# Patient Record
Sex: Male | Born: 1990 | Race: Black or African American | Hispanic: No | Marital: Single | Smoking: Current every day smoker
Health system: Southern US, Community
[De-identification: ages and names within clinical notes are randomized; demographics above are authoritative.]

---
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
## 2012-04-27 ENCOUNTER — Emergency Department (HOSPITAL_COMMUNITY): Payer: Medicaid Other

## 2012-04-27 ENCOUNTER — Encounter (HOSPITAL_COMMUNITY): Payer: Self-pay | Admitting: Emergency Medicine

## 2012-04-27 ENCOUNTER — Emergency Department (HOSPITAL_COMMUNITY)
Admission: EM | Admit: 2012-04-27 | Discharge: 2012-04-27 | Disposition: A | Discharge status: Home / Self Care | Payer: Medicaid Other | Attending: Emergency Medicine | Admitting: Emergency Medicine

## 2012-04-27 DIAGNOSIS — S199XXA Unspecified injury of neck, initial encounter: Secondary | ICD-10-CM | POA: Insufficient documentation

## 2012-04-27 DIAGNOSIS — S21109A Unspecified open wound of unspecified front wall of thorax without penetration into thoracic cavity, initial encounter: Secondary | ICD-10-CM

## 2012-04-27 DIAGNOSIS — S0990XA Unspecified injury of head, initial encounter: Secondary | ICD-10-CM | POA: Insufficient documentation

## 2012-04-27 DIAGNOSIS — T148XXA Other injury of unspecified body region, initial encounter: Secondary | ICD-10-CM | POA: Insufficient documentation

## 2012-04-27 DIAGNOSIS — S0993XA Unspecified injury of face, initial encounter: Secondary | ICD-10-CM | POA: Insufficient documentation

## 2012-04-27 LAB — URINALYSIS, MICROSCOPIC ONLY
Bilirubin Urine: NEGATIVE
Ketones, ur: 15 mg/dL — AB
Leukocytes, UA: NEGATIVE
Nitrite: NEGATIVE
Protein, ur: NEGATIVE mg/dL

## 2012-04-27 LAB — POCT I-STAT, CHEM 8
BUN: 17 mg/dL (ref 6–23)
Creatinine, Ser: 1.4 mg/dL — ABNORMAL HIGH (ref 0.50–1.35)
Hemoglobin: 18.4 g/dL — ABNORMAL HIGH (ref 13.0–17.0)
Potassium: 5.3 mEq/L — ABNORMAL HIGH (ref 3.5–5.1)
Sodium: 140 mEq/L (ref 135–145)
TCO2: 24 mmol/L (ref 0–100)

## 2012-04-27 LAB — CBC
MCH: 28.1 pg (ref 26.0–34.0)
MCHC: 33.7 g/dL (ref 30.0–36.0)
MCV: 83.3 fL (ref 78.0–100.0)
Platelets: 131 10*3/uL — ABNORMAL LOW (ref 150–400)

## 2012-04-27 LAB — PROTIME-INR
INR: 0.97 (ref 0.00–1.49)
Prothrombin Time: 13.1 seconds (ref 11.6–15.2)

## 2012-04-27 LAB — COMPREHENSIVE METABOLIC PANEL
ALT: 34 U/L (ref 0–53)
AST: 33 U/L (ref 0–37)
Albumin: 4.7 g/dL (ref 3.5–5.2)
Alkaline Phosphatase: 76 U/L (ref 39–117)
CO2: 21 mEq/L (ref 19–32)
Chloride: 101 mEq/L (ref 96–112)
GFR calc non Af Amer: >90 mL/min (ref >90)
Potassium: 4.3 mEq/L (ref 3.5–5.1)
Sodium: 140 mEq/L (ref 135–145)
Total Bilirubin: 0.8 mg/dL (ref 0.3–1.2)

## 2012-04-27 LAB — ABO/RH: ABO/RH(D): O NEG

## 2012-04-27 LAB — SAMPLE TO BLOOD BANK

## 2012-04-27 MED ORDER — HYDROCODONE-ACETAMINOPHEN 5-500 MG PO TABS: 1.0000 | ORAL_TABLET | Freq: Four times a day (QID) | ORAL | Status: AC | PRN

## 2012-04-27 MED ORDER — ONDANSETRON HCL 4 MG/2ML IJ SOLN
INTRAMUSCULAR | Status: AC
  Administered 2012-04-27: 4 mg
  Filled 2012-04-27: qty 2

## 2012-04-27 MED ORDER — IOHEXOL 300 MG/ML  SOLN
100.0000 mL | Freq: Once | INTRAMUSCULAR | Status: AC | PRN
  Administered 2012-04-27: 100 mL via INTRAVENOUS

## 2012-04-27 MED ORDER — MORPHINE SULFATE 4 MG/ML IJ SOLN
4.0000 mg | Freq: Once | INTRAMUSCULAR | Status: AC
  Administered 2012-04-27: 4 mg via INTRAVENOUS
  Filled 2012-04-27: qty 1

## 2012-04-27 MED ORDER — TETANUS-DIPHTHERIA TOXOIDS TD 5-2 LFU IM INJ
0.5000 mL | INJECTION | Freq: Once | INTRAMUSCULAR | Status: AC
  Administered 2012-04-27: 0.5 mL via INTRAMUSCULAR
  Filled 2012-04-27: qty 0.5

## 2012-04-27 MED ORDER — CEFAZOLIN SODIUM 1-5 GM-% IV SOLN
1.0000 g | Freq: Once | INTRAVENOUS | Status: AC
  Administered 2012-04-27: 1 g via INTRAVENOUS
  Filled 2012-04-27: qty 50
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
## 2012-04-27 NOTE — ED Notes (Signed)
Vital signs stable. 

## 2012-04-27 NOTE — ED Notes (Signed)
Family at beside. Family given emotional support. 
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
## 2012-04-28 LAB — TYPE AND SCREEN
Antibody Screen: NEGATIVE
Unit division: 0
Unit division: 0

## 2012-05-05 ENCOUNTER — Emergency Department (HOSPITAL_COMMUNITY)
Admission: EM | Admit: 2012-05-05 | Discharge: 2012-05-05 | Disposition: A | Discharge status: Home / Self Care | Payer: Medicaid Other | Attending: Emergency Medicine | Admitting: Emergency Medicine

## 2012-05-05 ENCOUNTER — Encounter (HOSPITAL_COMMUNITY): Payer: Self-pay | Admitting: Emergency Medicine

## 2012-05-05 DIAGNOSIS — Z87828 Personal history of other (healed) physical injury and trauma: Secondary | ICD-10-CM | POA: Insufficient documentation

## 2012-05-05 DIAGNOSIS — Z4802 Encounter for removal of sutures: Secondary | ICD-10-CM

## 2012-05-05 MED ORDER — BACITRACIN 500 UNIT/GM EX OINT
1.0000 "application " | TOPICAL_OINTMENT | Freq: Two times a day (BID) | CUTANEOUS | Status: DC
  Administered 2012-05-05: 1 via TOPICAL
  Filled 2012-05-05 (×2): qty 0.9
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
## 2012-05-05 NOTE — Discharge Instructions (Signed)
Scar Minimization You will have a scar anytime you have surgery and a cut is made in the skin or you have something removed from your skin (mole, skin cancer, cyst). Although scars are unavoidable following surgery, there are ways to minimize their appearance. It is important to follow all the instructions you receive from your caregiver about wound care. How your wound heals will influence the appearance of your scar. If you do not follow the wound care instructions as directed, complications such as infection may occur. Wound instructions include keeping the wound clean, moist, and not letting the wound form a scab. Some people form scars that are raised and lumpy (hypertrophic) or larger than the initial wound (keloidal). HOME CARE INSTRUCTIONS   Follow wound care instructions as directed.   Keep the wound clean by washing it with soap and water.   Keep the wound moist with provided antibiotic cream or petroleum jelly until completely healed. Moisten twice a day for about 2 weeks.   Get stitches (sutures) taken out at the scheduled time.   Avoid touching or manipulating your wound unless needed. Wash your hands thoroughly before and after touching your wound.   Follow all restrictions such as limits on exercise or work. This depends on where your scar is located.   Keep the scar protected from sunburn. Cover the scar with sunscreen/sunblock with SPF 30 or higher.   Gently massage the scar using a circular motion to help minimize the appearance of the scar. Do this only after the wound has closed and all the sutures have been removed.   For hypertrophic or keloidal scars, there are several ways to treat and minimize their appearance. Methods include compression therapy, intralesional corticosteroids, laser therapy, or surgery. These methods are performed by your caregiver.  Remember that the scar may appear lighter or darker than your normal skin color. This difference in color should even  out with time. SEEK MEDICAL CARE IF:   You have a fever.   You develop signs of infection such as pain, redness, pus, and warmth.   You have questions or concerns.  Document Released: 04/25/2010 Document Revised: 10/25/2011 Document Reviewed: 04/25/2010 [employer] Patient Information 2012 [employer]. 

## 2012-05-05 NOTE — ED Provider Notes (Signed)
History     CSN: [id number]  Arrival date & time 05/05/12  4098   First MD Initiated Contact with Patient 05/05/12 1000      Chief Complaint  Patient presents with  . Wound Check    (Consider location/radiation/quality/duration/timing/severity/associated sxs/prior treatment) Patient is a 21 y.o. male presenting with wound check. The history is provided by the patient.  Wound Check  He was treated in the ED 5 to 10 days ago. Previous treatment in the ED includes laceration repair. There has been no treatment since the wound repair. Fever duration: no fever. There has been colored (for first couple of days, resolved) discharge from the wound. There is no redness present. There is no swelling present. The pain has improved. He has no difficulty moving the affected extremity or digit.    History reviewed. No pertinent past medical history.  History reviewed. No pertinent past surgical history.  No family history on file.  History  Substance Use Topics  . Smoking status: Never Smoker   . Smokeless tobacco: Not on file  . Alcohol Use: Yes      Review of Systems  Constitutional: Negative for fever and chills.  Skin: Positive for wound. Negative for color change.    Allergies  Review of patient's allergies indicates no known allergies.  Home Medications   Current Outpatient Rx  Name Route Sig Dispense Refill  . HYDROCODONE-ACETAMINOPHEN 5-500 MG PO TABS Oral Take 1 tablet by mouth every 6 (six) hours as needed for pain. 30 tablet 0    BP 108/69  Pulse 68  Temp 97.9 F (36.6 C) (Oral)  Resp 15  SpO2 100%  Physical Exam  Nursing note reviewed. Constitutional: He appears well-developed and well-nourished. No distress.       Vital signs are reviewed and are normal.   HENT:  Head: Normocephalic.  Eyes: Conjunctivae are normal.  Neck: Neck supple.  Cardiovascular: Normal rate.   Pulmonary/Chest: No respiratory distress.  Neurological: He is alert.  Skin: Skin is  warm and dry.       ED Course  SUTURE REMOVAL Performed by: Lorenz Coaster JUSTICE Authorized by: Shaaron Adler Consent: Verbal consent obtained. Risks and benefits: risks, benefits and alternatives were discussed Consent given by: patient Patient understanding: patient states understanding of the procedure being performed Patient identity confirmed: verbally with patient Body area: trunk Location details: back Wound Appearance: clean Staples Removed: 6 Post-removal: antibiotic ointment applied and dressing applied Facility: sutures placed in this facility Patient tolerance: Patient tolerated the procedure well with no immediate complications.   (including critical care time)  Labs Reviewed - No data to display No results found.   1. Encounter for staple removal       MDM  Staple removal. Healing well.         Shaaron Adler, PA-C 05/05/12 1031

## 2012-05-06 NOTE — ED Provider Notes (Signed)
Medical screening examination/treatment/procedure(s) were performed by non-physician practitioner and as supervising physician I was immediately available for consultation/collaboration.   Glynn Octave, MD 05/06/12 [phone]

## 2014-11-27 ENCOUNTER — Encounter (HOSPITAL_COMMUNITY): Payer: Self-pay | Admitting: Emergency Medicine

## 2014-11-27 ENCOUNTER — Emergency Department (HOSPITAL_COMMUNITY)
Admission: EM | Admit: 2014-11-27 | Discharge: 2014-11-27 | Disposition: A | Discharge status: Home / Self Care | Payer: Medicaid Other | Attending: Emergency Medicine | Admitting: Emergency Medicine

## 2014-11-27 DIAGNOSIS — J029 Acute pharyngitis, unspecified: Secondary | ICD-10-CM

## 2014-11-27 DIAGNOSIS — J36 Peritonsillar abscess: Secondary | ICD-10-CM

## 2014-11-27 DIAGNOSIS — F419 Anxiety disorder, unspecified: Secondary | ICD-10-CM | POA: Insufficient documentation

## 2014-11-27 DIAGNOSIS — Z87891 Personal history of nicotine dependence: Secondary | ICD-10-CM | POA: Insufficient documentation

## 2014-11-27 LAB — RAPID STREP SCREEN (MED CTR MEBANE ONLY): Streptococcus, Group A Screen (Direct): NEGATIVE

## 2014-11-27 MED ORDER — HYDROCODONE-ACETAMINOPHEN 7.5-325 MG/15ML PO SOLN: 10.0000 mL | Freq: Four times a day (QID) | ORAL | Status: DC | PRN

## 2014-11-27 MED ORDER — HYDROCODONE-ACETAMINOPHEN 7.5-325 MG/15ML PO SOLN
10.0000 mL | Freq: Once | ORAL | Status: AC
Start: 2014-11-27 — End: 2014-11-27
  Administered 2014-11-27: 10 mL via ORAL
  Filled 2014-11-27: qty 15

## 2014-11-27 MED ORDER — CLINDAMYCIN HCL 150 MG PO CAPS: 300.0000 mg | ORAL_CAPSULE | Freq: Three times a day (TID) | ORAL | Status: DC

## 2014-11-27 MED ORDER — CLINDAMYCIN HCL 150 MG PO CAPS
300.0000 mg | ORAL_CAPSULE | Freq: Once | ORAL | Status: AC
  Administered 2014-11-27: 300 mg via ORAL
  Filled 2014-11-27: qty 2

## 2014-11-27 NOTE — ED Notes (Signed)
Patient here with complaint of sore throat with right sided neck pain. States that it began 2 weeks ago. Denies fevers at home. States that pain sometimes is on the left. Additionally reports lots of pain with swallowing.

## 2014-11-27 NOTE — ED Provider Notes (Signed)
CSN: [id number]     Arrival date & time 11/27/14  2032 History  This chart was scribed for Junius FinnerErin O'Malley, working with Juliet RudeNathan R. Rubin PayorPickering, MD, by Roxy Cedarhandni Bhalodia ED Scribe. This patient was seen in room TR05C/TR05C and the patient's care was started at 9:03 PM  Chief Complaint  Patient presents with  . Sore Throat   Patient is a 24 y.o. male presenting with pharyngitis. The history is provided by the patient. No language interpreter was used.  Sore Throat   HPI Comments: Michael Sutton is a 24 y.o. male who presents to the Emergency Department complaining of sore throat, neck pain, and swollen glands that began 2 weeks ago. He denies associated fever, nausea or vomiting. He reports associated trouble swallowing. Patient states he has been taking advil and liquids with mild relief. Patient has trouble speaking since 5:00PM earlier today. He states that the neck pain is worse on the left side. He currently rates his pain as 7/10. Denies fever or chills. Denies n/v/d.  History reviewed. No pertinent past medical history. History reviewed. No pertinent past surgical history. History reviewed. No pertinent family history. History  Substance Use Topics  . Smoking status: Former Smoker    Quit date: 11/13/2014  . Smokeless tobacco: Not on file  . Alcohol Use: Yes   Review of Systems  Constitutional: Negative for fever.  HENT: Positive for sore throat and trouble swallowing.   Musculoskeletal: Positive for neck pain.  All other systems reviewed and are negative.  Allergies  Review of patient's allergies indicates no known allergies.  Home Medications   Prior to Admission medications   Medication Sig Start Date End Date Taking? Authorizing Provider  clindamycin (CLEOCIN) 150 MG capsule Take 2 capsules (300 mg total) by mouth 3 (three) times daily. Take 2 capsules three times daily for 10 days 11/27/14   Junius FinnerErin O'Malley, PA-C  HYDROcodone-acetaminophen (HYCET) 7.5-325 mg/15 ml solution Take  10 mLs by mouth every 6 (six) hours as needed for moderate pain. 11/27/14   Junius FinnerErin O'Malley, PA-C   Triage Vitals: BP 113/62 mmHg  Pulse 107  Temp(Src) 98.2 F (36.8 C) (Oral)  Resp 20  Ht 6' (1.829 m)  Wt 180 lb (81.647 kg)  BMI 24.41 kg/m2  SpO2 100%  Physical Exam  Constitutional: He appears well-developed and well-nourished.  Sitting in exam chair. Non toxic appearing. Mildly anxious.  HENT:  Head: Normocephalic and atraumatic.  Significant bilateral tonsillar edema and erythema without exudates. Uvula midline. Decreased airway due to significant edema.   Eyes: Conjunctivae are normal. No scleral icterus.  Neck: Normal range of motion.  Muffled voice without stridor. Anterior cervical adenopathy, right greater than the left.  Cardiovascular: Normal rate, regular rhythm and normal heart sounds.   Pulmonary/Chest: Effort normal and breath sounds normal. No respiratory distress. He has no wheezes. He has no rales. He exhibits no tenderness.  Abdominal: Soft. Bowel sounds are normal. He exhibits no distension and no mass. There is no tenderness. There is no rebound and no guarding.  Musculoskeletal: Normal range of motion.  Neurological: He is alert.  Skin: Skin is warm and dry.  Nursing note and vitals reviewed.  ED Course  Procedures (including critical care time)  DIAGNOSTIC STUDIES: Oxygen Saturation is 100% on RA, normal by my interpretation.    COORDINATION OF CARE: 9:05 PM- Discussed plans to order diagnostic rapid strep test. Pt advised of plan for treatment and pt agrees.  Labs Review Labs Reviewed  RAPID STREP SCREEN  CULTURE, GROUP A STREP   Imaging Review No results found.   EKG Interpretation None     MDM   Final diagnoses:  Peritonsillar abscess  Sore throat    Pt is a 24yo male c/o sore throat, change in voice starting around 5PM today. Concern for peritonsillar abscess.  Discussed pt with Dr. Rubin Payor who also examined pt, agrees.  Consulted with  Dr. Pollyann Kennedy, ENT, advised pt may be tx with Clindamycin  TID and pain medication. Advised to call office Monday, 11/29/14 to schedule appointment.   Pt was able to swallow 2 capsules of clindamycin in ED w/o difficulty.  Pt is hemodynamically stable for discharge home. Home care instructions provided.  Strict return precautions provided. Pt verbalized understanding and agreement with tx plan.   I personally performed the services described in this documentation, which was scribed in my presence. The recorded information has been reviewed and is accurate.  Junius Finner, PA-C 11/27/14 2208  Juliet Rude. Rubin Payor, MD 11/28/14 1534

## 2014-11-30 LAB — CULTURE, GROUP A STREP

## 2014-12-01 ENCOUNTER — Telehealth (HOSPITAL_BASED_OUTPATIENT_CLINIC_OR_DEPARTMENT_OTHER): Payer: Self-pay | Admitting: Emergency Medicine

## 2014-12-01 NOTE — Telephone Encounter (Signed)
Post ED Visit - Positive Culture Follow-up  Culture report reviewed by antimicrobial stewardship pharmacist: []  Wes Dulaney, Pharm.D., BCPS []  Celedonio MiyamotoJeremy Frens, Pharm.D., BCPS []  Georgina PillionElizabeth Martin, Pharm.D., BCPS []  NapavineMinh Pham, VermontPharm.D., BCPS, AAHIVP []  Estella HuskMichelle Turner, Pharm.D., BCPS, AAHIVP [x]  Elder CyphersLorie Poole, [address]Pharm.D., BCPS  Positive strep culture Treated with clindamycin, organism sensitive to the same and no further patient follow-up is required at this time.  Berle MullMiller, Vesper Trant 12/01/2014, 3:37 PM

## 2017-05-31 ENCOUNTER — Emergency Department (HOSPITAL_COMMUNITY)
Admission: EM | Admit: 2017-05-31 | Discharge: 2017-05-31 | Disposition: A | Discharge status: Home / Self Care | Payer: Self-pay | Attending: Emergency Medicine | Admitting: Emergency Medicine

## 2017-05-31 ENCOUNTER — Encounter (HOSPITAL_COMMUNITY): Payer: Self-pay

## 2017-05-31 ENCOUNTER — Emergency Department (HOSPITAL_COMMUNITY): Payer: Self-pay

## 2017-05-31 DIAGNOSIS — S62164A Nondisplaced fracture of pisiform, right wrist, initial encounter for closed fracture: Secondary | ICD-10-CM | POA: Insufficient documentation

## 2017-05-31 DIAGNOSIS — Y999 Unspecified external cause status: Secondary | ICD-10-CM | POA: Insufficient documentation

## 2017-05-31 DIAGNOSIS — R300 Dysuria: Secondary | ICD-10-CM | POA: Insufficient documentation

## 2017-05-31 DIAGNOSIS — Z87891 Personal history of nicotine dependence: Secondary | ICD-10-CM | POA: Insufficient documentation

## 2017-05-31 DIAGNOSIS — Y929 Unspecified place or not applicable: Secondary | ICD-10-CM | POA: Insufficient documentation

## 2017-05-31 DIAGNOSIS — Y939 Activity, unspecified: Secondary | ICD-10-CM | POA: Insufficient documentation

## 2017-05-31 LAB — URINALYSIS, ROUTINE W REFLEX MICROSCOPIC
Bilirubin Urine: NEGATIVE
GLUCOSE, UA: NEGATIVE mg/dL
Hgb urine dipstick: NEGATIVE
Ketones, ur: NEGATIVE mg/dL
LEUKOCYTES UA: NEGATIVE
Nitrite: NEGATIVE
PH: 7 (ref 5.0–8.0)
PROTEIN: NEGATIVE mg/dL
Specific Gravity, Urine: 1.023 (ref 1.005–1.030)

## 2017-05-31 MED ORDER — IBUPROFEN 600 MG PO TABS: 600.0000 mg | ORAL_TABLET | Freq: Four times a day (QID) | ORAL | 0 refills | Status: DC | PRN
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
## 2017-05-31 NOTE — ED Notes (Signed)
Pt is back from x-ray

## 2017-05-31 NOTE — Discharge Instructions (Addendum)
Please read and follow all provided instructions.  Your diagnoses today include:  1. Closed nondisplaced fracture of pisiform of right wrist, initial encounter     Tests performed today include: Vital signs. See below for your results today.   Medications prescribed:  Take as prescribed   Home care instructions:  Follow any educational materials contained in this packet.  Follow-up instructions: Please follow-up with your primary care provider for further evaluation of symptoms and treatment   Return instructions:  Please return to the Emergency Department if you do not get better, if you get worse, or new symptoms OR  - Fever (temperature greater than 101.19F)  - Bleeding that does not stop with holding pressure to the area    -Severe pain (please note that you may be more sore the day after your accident)  - Chest Pain  - Difficulty breathing  - Severe nausea or vomiting  - Inability to tolerate food and liquids  - Passing out  - Skin becoming red around your wounds  - Change in mental status (confusion or lethargy)  - New numbness or weakness    Please return if you have any other emergent concerns.  Additional Information:  Your vital signs today were: BP 105/75 (BP Location: Left Arm)    Pulse 65    Temp 98 F (36.7 C) (Oral)    Resp 17    SpO2 99%  If your blood pressure (BP) was elevated above 135/85 this visit, please have this repeated by your doctor within one month. ---------------
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
## 2017-06-03 ENCOUNTER — Encounter: Payer: Self-pay | Admitting: Emergency Medicine

## 2017-06-03 ENCOUNTER — Telehealth: Payer: Self-pay | Admitting: Medical

## 2017-06-03 DIAGNOSIS — A749 Chlamydial infection, unspecified: Secondary | ICD-10-CM

## 2017-06-03 LAB — GC/CHLAMYDIA PROBE AMP (~~LOC~~) NOT AT ARMC
CHLAMYDIA, DNA PROBE: POSITIVE — AB
Neisseria Gonorrhea: NEGATIVE

## 2017-06-03 MED ORDER — AZITHROMYCIN 250 MG PO TABS: 1000.0000 mg | ORAL_TABLET | Freq: Once | ORAL | 0 refills | Status: AC

## 2017-06-03 NOTE — Telephone Encounter (Addendum)
Michael Sutton tested positive for  Chlamydia. Patient was called by RN and allergies and pharmacy confirmed. Rx sent to pharmacy of choice.   Marny LowensteinWenzel, Julie N, PA-C 06/03/2017 1:23 PM       ----- Message from Kathe BectonLori S Berdik, RN sent at 06/03/2017  9:55 AM EDT ----- This patient tested positive for chlamydia Hs NKDA, I have informed the patient of his results and confirmed his pharmacy is correct in her chart. Please send Rx.   Thank you,   Kathe BectonBerdik, Lori S, RN   [facility]

## 2017-12-06 ENCOUNTER — Other Ambulatory Visit: Payer: Self-pay

## 2017-12-06 ENCOUNTER — Encounter (HOSPITAL_COMMUNITY): Payer: Self-pay | Admitting: Emergency Medicine

## 2017-12-06 ENCOUNTER — Ambulatory Visit (HOSPITAL_COMMUNITY)
Admission: EM | Admit: 2017-12-06 | Discharge: 2017-12-06 | Disposition: A | Discharge status: Home / Self Care | Payer: Self-pay | Attending: Emergency Medicine | Admitting: Emergency Medicine

## 2017-12-06 DIAGNOSIS — Z113 Encounter for screening for infections with a predominantly sexual mode of transmission: Secondary | ICD-10-CM | POA: Insufficient documentation

## 2017-12-06 DIAGNOSIS — F17210 Nicotine dependence, cigarettes, uncomplicated: Secondary | ICD-10-CM | POA: Insufficient documentation

## 2017-12-06 DIAGNOSIS — N4889 Other specified disorders of penis: Secondary | ICD-10-CM

## 2017-12-06 DIAGNOSIS — R21 Rash and other nonspecific skin eruption: Secondary | ICD-10-CM | POA: Insufficient documentation

## 2017-12-06 LAB — POCT URINALYSIS DIP (DEVICE)
BILIRUBIN URINE: NEGATIVE
Glucose, UA: NEGATIVE mg/dL
Hgb urine dipstick: NEGATIVE
Ketones, ur: NEGATIVE mg/dL
LEUKOCYTES UA: NEGATIVE
NITRITE: NEGATIVE
PH: 7 (ref 5.0–8.0)
Protein, ur: NEGATIVE mg/dL
Specific Gravity, Urine: 1.02 (ref 1.005–1.030)
Urobilinogen, UA: 0.2 mg/dL (ref 0.0–1.0)

## 2017-12-06 MED ORDER — AZITHROMYCIN 250 MG PO TABS
ORAL_TABLET | ORAL | Status: AC
  Filled 2017-12-06: qty 4

## 2017-12-06 MED ORDER — AZITHROMYCIN 250 MG PO TABS
1000.0000 mg | ORAL_TABLET | Freq: Once | ORAL | Status: AC
  Administered 2017-12-06: 1000 mg via ORAL

## 2017-12-06 MED ORDER — MUPIROCIN 2 % EX OINT: 1.0000 "application " | TOPICAL_OINTMENT | Freq: Three times a day (TID) | CUTANEOUS | 0 refills | Status: DC

## 2017-12-06 MED ORDER — CEFTRIAXONE SODIUM 250 MG IJ SOLR
INTRAMUSCULAR | Status: AC
  Filled 2017-12-06: qty 250

## 2017-12-06 MED ORDER — CEFTRIAXONE SODIUM 250 MG IJ SOLR
250.0000 mg | Freq: Once | INTRAMUSCULAR | Status: AC
  Administered 2017-12-06: 250 mg via INTRAMUSCULAR

## 2017-12-06 MED ORDER — ACYCLOVIR 800 MG PO TABS: 800.0000 mg | ORAL_TABLET | Freq: Three times a day (TID) | ORAL | 0 refills | Status: AC

## 2017-12-06 NOTE — ED Triage Notes (Signed)
Pt reports a small red spot/rash on the side of his penis that has been there for 3-4 days.

## 2017-12-06 NOTE — ED Provider Notes (Signed)
HPI  SUBJECTIVE:  Michael Sutton is a 27 y.o. male who presents with a penile rash starting 3-4 days ago.  There were no proceeding paresthesias.  It does not hurt, itch, burn.  He denies ulcers. States that they started off as "bumps"   He denies penile discharge, rash elsewhere, testicular scrotal erythema, pain, edema.  No flulike symptoms.  No dysuria, urgency, frequency, cloudy odorous urine, hematuria.  No fevers.  He had a rash in this area before and was thought to have a "STD" but he does not remember the diagnosis.  States that he was given some pills which he did not take as prescribed.  He has tried hydrocortisone and triple antibiotic ointment with some improvement of symptoms.  No aggravating factors.  He is in a new monogamous relationship with a male who is asymptomatic.  He has a past medical history of an unknown STD.  He denies history of gonorrhea, chlamydia, HIV, HSV, syphilis, Trichomonas.  PMD: None.  History reviewed. No pertinent past medical history.  History reviewed. No pertinent surgical history.  History reviewed. No pertinent family history.  Social History   Tobacco Use  . Smoking status: Current Every Day Smoker    Packs/day: 0.25    Types: Cigarettes  . Smokeless tobacco: Never Used  Substance Use Topics  . Alcohol use: Yes  . Drug use: No    No current facility-administered medications for this encounter.   Current Outpatient Medications:  .  acyclovir (ZOVIRAX) 800 MG tablet, Take 1 tablet (800 mg total) by mouth 3 (three) times daily for 2 days. X 2 days, Disp: 12 tablet, Rfl: 0 .  mupirocin ointment (BACTROBAN) 2 %, Apply 1 application topically 3 (three) times daily., Disp: 22 g, Rfl: 0  No Known Allergies   ROS  As noted in HPI.   Physical Exam  BP 94/74 (BP Location: Left Arm)   Pulse 87   Temp (!) 97.3 F (36.3 C) (Oral)   SpO2 100%   Constitutional: Well developed, well nourished, no acute distress Eyes:  EOMI, conjunctiva  normal bilaterally HENT: Normocephalic, atraumatic,mucus membranes moist Respiratory: Normal inspiratory effort Cardiovascular: Normal rate GI: nondistended GU: Nontender ulcers on the shaft of the right penis.  3 bumps that appear to be vesicular.  No discharge.  No other genital rash.  No testicular or epididymal tenderness, swelling.  No scrotal edema, erythema, tenderness. patient declined chaperone.      Lymph: No inguinal lymphadenopathy skin: No palmar rash, Musculoskeletal: no deformities Neurologic: Alert & oriented x 3, no focal neuro deficits Psychiatric: Speech and behavior appropriate   ED Course   Medications  cefTRIAXone (ROCEPHIN) injection 250 mg (250 mg Intramuscular Given 12/06/17 1217)  azithromycin (ZITHROMAX) tablet 1,000 mg (1,000 mg Oral Given 12/06/17 1217)    Orders Placed This Encounter  Procedures  . Hsv Culture And Typing    Standing Status:   Standing    Number of Occurrences:   1    Order Specific Question:   Patient immune status    Answer:   Normal  . POCT urinalysis dip (device)    Standing Status:   Standing    Number of Occurrences:   1    Results for orders placed or performed during the hospital encounter of 12/06/17 (from the past 24 hour(s))  RPR     Status: None   Collection Time: 12/06/17 11:59 AM  Result Value Ref Range   RPR Ser Ql Non Reactive Non Reactive  HIV antibody     Status: None   Collection Time: 12/06/17 11:59 AM  Result Value Ref Range   HIV Screen 4th Generation wRfx Non Reactive Non Reactive  POCT urinalysis dip (device)     Status: None   Collection Time: 12/06/17 12:21 PM  Result Value Ref Range   Glucose, UA NEGATIVE NEGATIVE mg/dL   Bilirubin Urine NEGATIVE NEGATIVE   Ketones, ur NEGATIVE NEGATIVE mg/dL   Specific Gravity, Urine 1.020 1.005 - 1.030   Hgb urine dipstick NEGATIVE NEGATIVE   pH 7.0 5.0 - 8.0   Protein, ur NEGATIVE NEGATIVE mg/dL   Urobilinogen, UA 0.2 0.0 - 1.0 mg/dL   Nitrite NEGATIVE  NEGATIVE   Leukocytes, UA NEGATIVE NEGATIVE   No results found.  ED Clinical Impression  Rash  Screening for STDs (sexually transmitted diseases)   ED Assessment/Plan  Suspect HSV as this is a recurrent rash in the same area.  Will swab this for HSV.  Will also treat empirically for gonorrhea and chlamydia today with Rocephin and azithromycin as patient is an unreliable historian.  We will send off RPR, HIV, HSV, gonorrhea, chlamydia, trichomonas. Of note, pt urinated immediately prior to evaluation. Will need to collect a specimen 1 hour after this.  Advised patient to give [place] a working phone numbers so that we can contact him with the results.  Discussed with him that he will need to have his partner/partners treated if any of his labs come back positive.  Will send home with acyclovir for the next outbreak.  Continue antibiotic ointment for now. Follow up with PMD of choice or health department.  Discussed  MDM, plan and followup with patient. . patient agrees with plan.   Meds ordered this encounter  Medications  . cefTRIAXone (ROCEPHIN) injection 250 mg  . azithromycin (ZITHROMAX) tablet 1,000 mg  . acyclovir (ZOVIRAX) 800 MG tablet    Sig: Take 1 tablet (800 mg total) by mouth 3 (three) times daily for 2 days. X 2 days    Dispense:  12 tablet    Refill:  0  . mupirocin ointment (BACTROBAN) 2 %    Sig: Apply 1 application topically 3 (three) times daily.    Dispense:  22 g    Refill:  0    *This clinic note was created using Scientist, clinical (histocompatibility and immunogenetics). Therefore, there may be occasional mistakes despite careful proofreading.   ?   Domenick Gong, MD 12/07/17 [phone]

## 2017-12-06 NOTE — Discharge Instructions (Signed)
Give us a working phone number so that we can contact you if needed. Refrain from sexual contact until you know your results and your partner(s) are treated if necessary. Return to the ER if you get worse, have a fever >100.4, or for any concerns.  Do not start the acyclovir at this point in time.  Rather save it if this rash comes back again.  Start the acyclovir immediately if that happens.  In the meantime, continue the triple antibiotic ointment or you may use the Bactroban.  Below is a list of primary care practices who are taking new patients for you to follow-up with. [facility] [address] [place], [place] [address] [phone]  [facility] [phone] [address] [place] [address]  [facility]: [address] [place] [address]  [phone]  [facility]: [address] [place] [address]   [phone]  [facility]: [address] [place] [address]  [phone]  [facility] : [address] [place] [address] [phone]  [facility]: [address] [place] [phone]  [facility]: [address] [place] [address] [phone]  Dr. Oneal GroutMahima Pandey [address] [facility] [address] [address] [facility] [place] [address]  [phone]  Dr. Jackie PlumGeorge Osei-Bonsu, [facility]. [address] [place], [place] [address]  [phone]  Go to www.goodrx.com to look up your medications. This will give you a list of where you can find your prescriptions at the most affordable prices. Or ask the pharmacist what the cash price is, or if they have any other discount programs available to help make your medication more affordable. This can be  less expensive than what you would pay with insurance.

## 2017-12-06 NOTE — ED Notes (Signed)
[facility]

## 2017-12-06 NOTE — ED Notes (Signed)
Patient is drinking water, still unable to void

## 2017-12-07 LAB — HIV ANTIBODY (ROUTINE TESTING W REFLEX): HIV Screen 4th Generation wRfx: NONREACTIVE

## 2017-12-07 LAB — RPR: RPR Ser Ql: NONREACTIVE

## 2017-12-09 LAB — HSV CULTURE AND TYPING

## 2017-12-09 LAB — URINE CYTOLOGY ANCILLARY ONLY
CHLAMYDIA, DNA PROBE: NEGATIVE
Neisseria Gonorrhea: NEGATIVE
Trichomonas: NEGATIVE

## 2018-03-27 ENCOUNTER — Emergency Department (HOSPITAL_COMMUNITY)
Admission: EM | Admit: 2018-03-27 | Discharge: 2018-03-27 | Disposition: A | Discharge status: Home / Self Care | Payer: BLUE CROSS/BLUE SHIELD | Attending: Emergency Medicine | Admitting: Emergency Medicine

## 2018-03-27 ENCOUNTER — Other Ambulatory Visit: Payer: Self-pay

## 2018-03-27 ENCOUNTER — Emergency Department (HOSPITAL_COMMUNITY): Payer: BLUE CROSS/BLUE SHIELD

## 2018-03-27 ENCOUNTER — Encounter (HOSPITAL_COMMUNITY): Payer: Self-pay | Admitting: Emergency Medicine

## 2018-03-27 DIAGNOSIS — M25512 Pain in left shoulder: Secondary | ICD-10-CM | POA: Insufficient documentation

## 2018-03-27 DIAGNOSIS — W19XXXA Unspecified fall, initial encounter: Secondary | ICD-10-CM

## 2018-03-27 DIAGNOSIS — F17210 Nicotine dependence, cigarettes, uncomplicated: Secondary | ICD-10-CM | POA: Diagnosis not present

## 2018-03-27 MED ORDER — METHOCARBAMOL 500 MG PO TABS: 500.0000 mg | ORAL_TABLET | Freq: Three times a day (TID) | ORAL | 0 refills | Status: DC | PRN

## 2018-03-27 MED ORDER — NAPROXEN 500 MG PO TABS: 500.0000 mg | ORAL_TABLET | Freq: Two times a day (BID) | ORAL | 0 refills | Status: DC

## 2018-03-27 NOTE — ED Provider Notes (Signed)
[facility] EMERGENCY DEPARTMENT Provider Note   CSN: [id number] Arrival date & time: 03/27/18  0825     History   Chief Complaint Chief Complaint  Patient presents with  . Shoulder Pain    HPI Michael Sutton is a 27 y.o. male. With a hx of tobacco abuse who presents to the ED with complaint of L shoulder pain that started approximately 5 days ago. Patient states he miss-stepped leading to a fall down a few stairs during which he fell on the L shoulder. States he may have hit his head, but is insure, he did not have any LOC. He states he is only having discomfort to the L shoulder from the fall. States pain is a 7/10 in severity, worse with movement, alleviated by nothing in particular. He did take an advil once without change. No meds today. Denies numbness, weakness, change in vision, or neck pain.   HPI  History reviewed. No pertinent past medical history.  There are no active problems to display for this patient.   History reviewed. No pertinent surgical history.      Home Medications    Prior to Admission medications   Medication Sig Start Date End Date Taking? Authorizing Provider  mupirocin ointment (BACTROBAN) 2 % Apply 1 application topically 3 (three) times daily. 12/06/17   Domenick Gong, MD    Family History History reviewed. No pertinent family history.  Social History Social History   Tobacco Use  . Smoking status: Current Every Day Smoker    Packs/day: 0.25    Types: Cigarettes  . Smokeless tobacco: Never Used  Substance Use Topics  . Alcohol use: Yes  . Drug use: No     Allergies   Patient has no known allergies.   Review of Systems Review of Systems  Constitutional: Negative for chills and fever.  Eyes: Negative for visual disturbance.  Respiratory: Negative for shortness of breath.   Cardiovascular: Negative for chest pain.  Musculoskeletal: Positive for arthralgias (Left shoulder). Negative for back pain and  neck pain.  Neurological: Negative for dizziness, weakness, light-headedness, numbness and headaches.     Physical Exam Updated Vital Signs BP 123/85 (BP Location: Right Arm)   Pulse 66   Temp 97.7 F (36.5 C)   Resp 12   SpO2 100%   Physical Exam  Constitutional: He appears well-developed and well-nourished.  Non-toxic appearance. No distress.  HENT:  Head: Normocephalic and atraumatic.  Right Ear: No hemotympanum.  Left Ear: No hemotympanum.  Nose: Nose normal.  Mouth/Throat: Uvula is midline.  Eyes: Pupils are equal, round, and reactive to light. Conjunctivae and EOM are normal. Right eye exhibits no discharge. Left eye exhibits no discharge.  Neck: Normal range of motion. Neck supple. No spinous process tenderness present.  Cardiovascular: Normal rate and regular rhythm.  No murmur heard. Pulses:      Radial pulses are 2+ on the right side, and 2+ on the left side.  Pulmonary/Chest: Breath sounds normal. No respiratory distress. He has no wheezes. He has no rales.  Abdominal: Soft. He exhibits no distension. There is no tenderness.  Musculoskeletal:  No obvious deformity, erythema, ecchymosis.  Upper extremities: Patient has full range of motion to bilateral shoulders, elbows, and wrist, he has some discomfort with left shoulder flexion.  He is tender to palpation over the trapezius, supraspinatus, and infraspinatus muscles on the left side.  No point/focal bony tenderness.  Upper extremities otherwise nontender patient is neurovascularly intact distally. Back: No  midline tenderness to palpation. Lower extremities: Nontender, normal range of motion.  Neurological: He is alert.  Clear speech.   Skin: Skin is warm and dry. No rash noted.  Psychiatric: He has a normal mood and affect. His behavior is normal.  Nursing note and vitals reviewed.   ED Treatments / Results  Labs (all labs ordered are listed, but only abnormal results are displayed) Labs Reviewed - No data to  display  EKG None  Radiology Dg Shoulder Left  Result Date: 03/27/2018 CLINICAL DATA:  Progressive shoulder pain since falling 2 weeks ago. Initial encounter. EXAM: LEFT SHOULDER - 2+ VIEW COMPARISON:  None. FINDINGS: The mineralization and alignment are normal. There is no evidence of acute fracture or dislocation. The subacromial space is preserved. No significant arthropathic changes. IMPRESSION: No evidence of acute left shoulder injury. Electronically Signed   By: Carey Bullocks M.D.   On: 03/27/2018 09:40    Procedures Procedures (including critical care time)  Medications Ordered in ED Medications - No data to display   Initial Impression / Assessment and Plan / ED Course  I have reviewed the triage vital signs and the nursing notes.  Pertinent labs & imaging results that were available during my care of the patient were reviewed by me and considered in my medical decision making (see chart for details).    Patient presents status post mechanical fall complaining of left shoulder pain.  Patient is nontoxic-appearing, in no apparent distress, initial vitals WNL.  No evidence of serious head/neck/back injury.  Left shoulder with muscular tenderness, no point/focal bony tenderness.  X-ray negative for fracture dislocation.  He is neurovascularly intact distally.  Will treat with naproxen and Robaxin, patient is aware he is not to drive or operate heavy machinery while taking Robaxin, with orthopedics follow-up. I discussed results, treatment plan, need for ortho follow-up, and return precautions with the patient. Provided opportunity for questions, patient confirmed understanding and is in agreement with plan.   Final Clinical Impressions(s) / ED Diagnoses   Final diagnoses:  Fall, initial encounter  Acute pain of left shoulder    ED Discharge Orders        Ordered    naproxen (NAPROSYN) 500 MG tablet  2 times daily     03/27/18 1033    methocarbamol (ROBAXIN) 500 MG tablet   Every 8 hours PRN     03/27/18 1033       Aldean Pipe, [place] R, PA-C 03/27/18 1103    Vanetta Mulders, MD 03/27/18 1616

## 2018-03-27 NOTE — Discharge Instructions (Signed)
Please read and follow all provided instructions.  You have been seen today for left shoulder pain after a fall.   Tests performed today include: An x-ray of the affected area - does NOT show any broken bones or dislocations.  Vital signs. See below for your results today.   Medications:  Naproxen is a nonsteroidal anti-inflammatory medication that will help with pain and swelling. Be sure to take this medication as prescribed with food, 1 pill every 12 hours,  It should be taken with food, as it can cause stomach upset, and more seriously, stomach bleeding. Do not take other nonsteroidal anti-inflammatory medications with this such as Advil, Motrin, or Aleve.   Robaxin is the muscle relaxer I have prescribed, this is meant to help with muscle tightness. Be aware that this medication may make you drowsy therefore the first time you take this it should be at a time you are in an environment where you can rest. Do not drive or operate heavy machinery when taking this medication.   You may also take Tylenol per over-the-counter dosing with these medications safely.   Follow-up instructions: Please follow-up with your primary care provider or the provided orthopedic physician (bone specialist) if you continue to have significant pain in 1 week. In this case you may have a more severe injury that requires further care.   Return instructions:  Please return if your fingers or hands are numb or tingling, appear gray or blue, or you have severe pain  Please return to the Emergency Department if you experience worsening symptoms.  Please return if you have any other emergent concerns. Additional Information:  Your vital signs today were: BP 123/85 (BP Location: Right Arm)    Pulse 66    Temp 97.7 F (36.5 C)    Resp 12    SpO2 100%  If your blood pressure (BP) was elevated above 135/85 this visit, please have this repeated by your doctor within one month. ---------------

## 2018-03-27 NOTE — ED Triage Notes (Signed)
Patient complains of left shoulder pain that increases with movement after a fall Tuesday. Denies LOC. Minimal decrease in range of motion.

## 2018-04-13 ENCOUNTER — Ambulatory Visit (HOSPITAL_COMMUNITY)
Admission: EM | Admit: 2018-04-13 | Discharge: 2018-04-13 | Disposition: A | Discharge status: Home / Self Care | Payer: BLUE CROSS/BLUE SHIELD | Attending: Family Medicine | Admitting: Family Medicine

## 2018-04-13 ENCOUNTER — Encounter (HOSPITAL_COMMUNITY): Payer: Self-pay | Admitting: *Deleted

## 2018-04-13 ENCOUNTER — Other Ambulatory Visit: Payer: Self-pay

## 2018-04-13 DIAGNOSIS — W19XXXA Unspecified fall, initial encounter: Secondary | ICD-10-CM

## 2018-04-13 DIAGNOSIS — M25512 Pain in left shoulder: Secondary | ICD-10-CM

## 2018-04-13 DIAGNOSIS — S46012A Strain of muscle(s) and tendon(s) of the rotator cuff of left shoulder, initial encounter: Secondary | ICD-10-CM

## 2018-04-13 MED ORDER — PREDNISONE 20 MG PO TABS: ORAL_TABLET | ORAL | 0 refills | Status: DC

## 2018-04-13 NOTE — ED Provider Notes (Signed)
[facility]   [id number] 04/13/18 Arrival Time: 1300   SUBJECTIVE:  Michael Sutton is a 27 y.o. male who presents to the urgent care with complaint of left shoulder pain after a fall 2 weeks ago.  From ED visit on 03/27/2018 Michael Sutton is a 27 y.o. male. With a hx of tobacco abuse who presents to the ED with complaint of L shoulder pain that started approximately 5 days ago. Patient states he miss-stepped leading to a fall down a few stairs during which he fell on the L shoulder. States he may have hit his head, but is insure, he did not have any LOC. He states he is only having discomfort to the L shoulder from the fall. States pain is a 7/10 in severity, worse with movement, alleviated by nothing in particular. He did take an advil once without change. No meds today. Denies numbness, weakness, change in vision, or neck pain.   History reviewed. No pertinent past medical history. History reviewed. No pertinent family history. Social History   Socioeconomic History  . Marital status: Single    Spouse name: Not on file  . Number of children: Not on file  . Years of education: Not on file  . Highest education level: Not on file  Occupational History  . Not on file  Social Needs  . Financial resource strain: Not on file  . Food insecurity:    Worry: Not on file    Inability: Not on file  . Transportation needs:    Medical: Not on file    Non-medical: Not on file  Tobacco Use  . Smoking status: Current Every Day Smoker    Packs/day: 0.25    Types: Cigarettes  . Smokeless tobacco: Never Used  Substance and Sexual Activity  . Alcohol use: Yes  . Drug use: No  . Sexual activity: Not on file  Lifestyle  . Physical activity:    Days per week: Not on file    Minutes per session: Not on file  . Stress: Not on file  Relationships  . Social connections:    Talks on phone: Not on file    Gets together: Not on file    Attends religious service: Not on file   Active member of club or organization: Not on file    Attends meetings of clubs or organizations: Not on file    Relationship status: Not on file  . Intimate partner violence:    Fear of current or ex partner: Not on file    Emotionally abused: Not on file    Physically abused: Not on file    Forced sexual activity: Not on file  Other Topics Concern  . Not on file  Social History Narrative  . Not on file   No outpatient medications have been marked as taking for the 04/13/18 encounter [facility] Encounter).   No Known Allergies    ROS: As per HPI, remainder of ROS negative.   OBJECTIVE:   Vitals:   04/13/18 1351  BP: (!) 111/59  Pulse: 69  Temp: 98.2 F (36.8 C)  TempSrc: Oral  SpO2: 100%     General appearance: alert; no distress Eyes: PERRL; EOMI; conjunctiva normal HENT: normocephalic; atraumatic; TMs normal, canal normal, external ears normal without trauma; nasal mucosa normal; oral mucosa normal Neck: supple Lungs: clear to auscultation bilaterally Heart: regular rate and rhythm Abdomen: soft, non-tender; bowel sounds normal; no masses or organomegaly; no guarding or rebound tenderness Back: no CVA  tenderness Extremities: no cyanosis or edema; left shoulder is mildly swollen with tenderness in the anterior joint line, the subacromial space, and the posterior joint line. He has full range of motion but does have pain with resisted internal and external rotation Skin: warm and dry Neurologic: normal gait; grossly normal Psychological: alert and cooperative; normal mood and affect      Labs:  Results for orders placed or performed during the hospital encounter of 12/06/17  Hsv Culture And Typing  Result Value Ref Range   HSV Culture/Type Comment    Source of Sample PENIS   RPR  Result Value Ref Range   RPR Ser Ql Non Reactive Non Reactive  HIV antibody  Result Value Ref Range   HIV Screen 4th Generation wRfx Non Reactive Non Reactive  POCT urinalysis  dip (device)  Result Value Ref Range   Glucose, UA NEGATIVE NEGATIVE mg/dL   Bilirubin Urine NEGATIVE NEGATIVE   Ketones, ur NEGATIVE NEGATIVE mg/dL   Specific Gravity, Urine 1.020 1.005 - 1.030   Hgb urine dipstick NEGATIVE NEGATIVE   pH 7.0 5.0 - 8.0   Protein, ur NEGATIVE NEGATIVE mg/dL   Urobilinogen, UA 0.2 0.0 - 1.0 mg/dL   Nitrite NEGATIVE NEGATIVE   Leukocytes, UA NEGATIVE NEGATIVE  Urine cytology ancillary only  Result Value Ref Range   Chlamydia Negative    Neisseria gonorrhea Negative    Trichomonas Negative     Labs Reviewed - No data to display  No results found.  EXAM: LEFT SHOULDER - 2+ VIEW  COMPARISON:  None.  FINDINGS: The mineralization and alignment are normal. There is no evidence of acute fracture or dislocation. The subacromial space is preserved. No significant arthropathic changes.  IMPRESSION: No evidence of acute left shoulder injury.   Electronically Signed   By: Carey Bullocks M.D.   On: 03/27/2018 09:40    ASSESSMENT & PLAN:  1. Traumatic tear of left rotator cuff, unspecified tear extent, initial encounter   Remember your exercises: 1. Hang the arm over a shelf and move in circular fashion 2: Internal and external rotation using a bungee cord  Meds ordered this encounter  Medications  . predniSONE (DELTASONE) 20 MG tablet    Sig: Two daily with food    Dispense:  10 tablet    Refill:  0    Reviewed expectations re: course of current medical issues. Questions answered. Outlined signs and symptoms indicating need for more acute intervention. Patient verbalized understanding. After Visit Summary given.    Procedures:      Elvina Sidle, MD 04/13/18 1433

## 2018-04-13 NOTE — ED Triage Notes (Signed)
Per pt he fell down the stairs and his left shoulder is hurting about 2wk,

## 2018-04-13 NOTE — Discharge Instructions (Signed)
Remember your exercises: 1. Hang the arm over a shelf and move in circular fashion 2: Internal and external rotation using a bungee cord

## 2018-05-05 ENCOUNTER — Ambulatory Visit (HOSPITAL_COMMUNITY)
Admission: EM | Admit: 2018-05-05 | Discharge: 2018-05-05 | Disposition: A | Discharge status: Home / Self Care | Payer: BLUE CROSS/BLUE SHIELD | Attending: Internal Medicine | Admitting: Internal Medicine

## 2018-05-05 ENCOUNTER — Encounter (HOSPITAL_COMMUNITY): Payer: Self-pay | Admitting: Family Medicine

## 2018-05-05 ENCOUNTER — Ambulatory Visit (INDEPENDENT_AMBULATORY_CARE_PROVIDER_SITE_OTHER): Payer: BLUE CROSS/BLUE SHIELD

## 2018-05-05 DIAGNOSIS — W2209XA Striking against other stationary object, initial encounter: Secondary | ICD-10-CM | POA: Diagnosis not present

## 2018-05-05 DIAGNOSIS — S60221A Contusion of right hand, initial encounter: Secondary | ICD-10-CM

## 2018-05-05 MED ORDER — IBUPROFEN 800 MG PO TABS
800.0000 mg | ORAL_TABLET | Freq: Once | ORAL | Status: AC
  Administered 2018-05-05: 800 mg via ORAL

## 2018-05-05 MED ORDER — IBUPROFEN 800 MG PO TABS
ORAL_TABLET | ORAL | Status: AC
  Filled 2018-05-05: qty 1

## 2018-05-05 MED ORDER — IBUPROFEN 400 MG PO TABS: 400.0000 mg | ORAL_TABLET | Freq: Four times a day (QID) | ORAL | 0 refills | Status: DC | PRN

## 2018-05-05 NOTE — Discharge Instructions (Signed)
Ice application.  Ibuprofen every 6 hours as needed for pain.  Light and regular activity.
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
## 2018-07-13 ENCOUNTER — Other Ambulatory Visit: Payer: Self-pay

## 2018-07-13 ENCOUNTER — Ambulatory Visit (HOSPITAL_COMMUNITY)
Admission: EM | Admit: 2018-07-13 | Discharge: 2018-07-13 | Disposition: A | Discharge status: Home / Self Care | Payer: BLUE CROSS/BLUE SHIELD | Attending: Physician Assistant | Admitting: Physician Assistant

## 2018-07-13 ENCOUNTER — Encounter (HOSPITAL_COMMUNITY): Payer: Self-pay

## 2018-07-13 DIAGNOSIS — H1031 Unspecified acute conjunctivitis, right eye: Secondary | ICD-10-CM

## 2018-07-13 MED ORDER — IBUPROFEN 600 MG PO TABS: 600.0000 mg | ORAL_TABLET | Freq: Four times a day (QID) | ORAL | 0 refills | Status: DC | PRN

## 2018-07-13 MED ORDER — POLYMYXIN B-TRIMETHOPRIM 10000-0.1 UNIT/ML-% OP SOLN: 1.0000 [drp] | Freq: Four times a day (QID) | OPHTHALMIC | 0 refills | Status: DC

## 2018-07-13 MED ORDER — FLUORESCEIN SODIUM 1 MG OP STRP
ORAL_STRIP | OPHTHALMIC | Status: AC
  Filled 2018-07-13: qty 1

## 2018-07-13 MED ORDER — TETRACAINE HCL 0.5 % OP SOLN
OPHTHALMIC | Status: AC
  Filled 2018-07-13: qty 4

## 2018-07-13 NOTE — Discharge Instructions (Signed)
Continue the gel drops.  Start the antibiotic.  Take the ibuprofen as prescribed for eye pain. If you are not getting better call Dr. Lucious [place] office.

## 2018-07-13 NOTE — ED Triage Notes (Signed)
Pink eye x 4 days. Right eye

## 2018-07-13 NOTE — ED Provider Notes (Signed)
07/13/2018 4:54 PM   DOB: [birth year] / MRN: [id number]  SUBJECTIVE:  Michael Sutton is a 27 y.o. male presenting for right eye draining and crusting that started 3 day ago.  Assoicates tearing.  Denies vision changes.  Has tried eye ointment with mild relief.    He has No Known Allergies.   He  has no past medical history on file.    He  reports that he has been smoking cigarettes. He has been smoking about 0.25 packs per day. He has never used smokeless tobacco. He reports that he drinks alcohol. He reports that he does not use drugs. He  has no sexual activity history on file. The patient  has no past surgical history on file.  His family history is not on file.  ROS Per HPI.   OBJECTIVE:  Wt 165 lb (74.8 kg)   BMI 22.38 kg/m   Wt Readings from Last 3 Encounters:  07/13/18 165 lb (74.8 kg)  11/27/14 180 lb (81.6 kg)   Temp Readings from Last 3 Encounters:  05/05/18 98.2 F (36.8 C)  04/13/18 98.2 F (36.8 C) (Oral)  03/27/18 97.7 F (36.5 C)   BP Readings from Last 3 Encounters:  05/05/18 101/64  04/13/18 (!) 111/59  03/27/18 118/83   Pulse Readings from Last 3 Encounters:  05/05/18 83  04/13/18 69  03/27/18 (!) 56    Physical Exam  Constitutional: He is oriented to person, place, and time. He appears well-developed. He does not appear ill.  Eyes: Pupils are equal, round, and reactive to light. Conjunctivae and EOM are normal.    Cardiovascular: Normal rate, regular rhythm and normal heart sounds.  Pulmonary/Chest: Effort normal.  Abdominal: He exhibits no distension.  Musculoskeletal: Normal range of motion.  Neurological: He is alert and oriented to person, place, and time. No cranial nerve deficit. Coordination normal.  Skin: Skin is warm and dry. He is not diaphoretic.  Psychiatric: He has a normal mood and affect.  Nursing note and vitals reviewed.     No results found for this or any previous visit (from the past 72 hour(s)).  No results  found.  ASSESSMENT AND PLAN:   Acute conjunctivitis of right eye, unspecified acute conjunctivitis type    Discharge Instructions     Continue the gel drops.  Start the antibiotic.  Take the ibuprofen as prescribed for eye pain. If you are not getting better call Dr. Lucious [place] office.         The patient is advised to call or return to clinic if he does not see an improvement in symptoms, or to seek the care of the closest emergency department if he worsens with the above plan.   Deliah BostonMichael Guss Farruggia, MHS, PA-C 07/13/2018 4:54 PM   Ofilia Neaslark, Babak Lucus L, PA-C 07/13/18 1654

## 2018-07-28 ENCOUNTER — Encounter (HOSPITAL_COMMUNITY): Payer: Self-pay

## 2018-07-28 ENCOUNTER — Ambulatory Visit (HOSPITAL_COMMUNITY)
Admission: EM | Admit: 2018-07-28 | Discharge: 2018-07-28 | Disposition: A | Discharge status: Home / Self Care | Payer: BLUE CROSS/BLUE SHIELD | Attending: Family Medicine | Admitting: Family Medicine

## 2018-07-28 DIAGNOSIS — R109 Unspecified abdominal pain: Secondary | ICD-10-CM

## 2018-07-28 MED ORDER — DICYCLOMINE HCL 20 MG PO TABS: 20.0000 mg | ORAL_TABLET | Freq: Two times a day (BID) | ORAL | 0 refills | Status: DC

## 2018-07-28 NOTE — Discharge Instructions (Addendum)
It was nice meeting you!!  We will give you some medication to help with abdominal cramping. Follow up as needed for continued or worsening symptoms

## 2018-07-28 NOTE — ED Provider Notes (Signed)
[facility]    CSN: [id number] Arrival date & time: 07/28/18  1548     History   Chief Complaint Chief Complaint  Patient presents with  . Abdominal Pain    HPI Michael Sutton is a 27 y.o. male.   Is a 27 year old male presents with intermittent abdominal cramping x1 week.  He has had increase in stools some of this being watery.  He denies any nausea, vomiting.  Reports the abdominal cramping is worse after eating.  Denies any fever, chills, body aches, fatigue.  Reports that his brother had similar symptoms.  He is able to eat and drink.  No dysuria, hematuria.  ROS per HPI      History reviewed. No pertinent past medical history.  There are no active problems to display for this patient.   History reviewed. No pertinent surgical history.     Home Medications    Prior to Admission medications   Medication Sig Start Date End Date Taking? Authorizing Provider  dicyclomine (BENTYL) 20 MG tablet Take 1 tablet (20 mg total) by mouth 2 (two) times daily. 07/28/18   Dahlia Byes A, NP  ibuprofen (ADVIL,MOTRIN) 600 MG tablet Take 1 tablet (600 mg total) by mouth every 6 (six) hours as needed. Patient not taking: Reported on 07/28/2018 07/13/18   Ofilia Neas, PA-C  trimethoprim-polymyxin b (POLYTRIM) ophthalmic solution Place 1 drop into the right eye every 6 (six) hours. 07/13/18   Ofilia Neas, PA-C    Family History History reviewed. No pertinent family history.  Social History Social History   Tobacco Use  . Smoking status: Current Every Day Smoker    Packs/day: 0.25    Types: Cigarettes  . Smokeless tobacco: Never Used  Substance Use Topics  . Alcohol use: Yes  . Drug use: No     Allergies   Patient has no known allergies.   Review of Systems Review of Systems   Physical Exam Triage Vital Signs ED Triage Vitals [07/28/18 1703]  Enc Vitals Group     BP 107/60     Pulse      Resp 16     Temp 97.8 F (36.6 C)     Temp src    SpO2 100 %     Weight 165 lb (74.8 kg)     Height      Head Circumference      Peak Flow      Pain Score 5     Pain Loc      Pain Edu?      Excl. in GC?    No data found.  Updated Vital Signs BP 107/60 (BP Location: Right Arm)   Temp 97.8 F (36.6 C)   Resp 16   Wt 165 lb (74.8 kg)   SpO2 100%   BMI 22.38 kg/m   Visual Acuity Right Eye Distance:   Left Eye Distance:   Bilateral Distance:    Right Eye Near:   Left Eye Near:    Bilateral Near:     Physical Exam  Constitutional: He is oriented to person, place, and time. He appears well-developed and well-nourished.  Non-toxic appearance. He does not appear ill. No distress.  Very pleasant. Non toxic or ill appearing.     HENT:  Head: Normocephalic and atraumatic.  Pulmonary/Chest: Effort normal.  Abdominal: Soft. Normal appearance. Bowel sounds are increased. There is no hepatosplenomegaly, splenomegaly or hepatomegaly. There is no tenderness. There is no rigidity, no rebound, no  guarding, no CVA tenderness, no tenderness at McBurney's point and negative Murphy's sign.  Abdomen soft, non tender. No masses or rebound.   Neurological: He is alert and oriented to person, place, and time.  Skin: Skin is warm and dry.  Psychiatric: He has a normal mood and affect.  Nursing note and vitals reviewed.    UC Treatments / Results  Labs (all labs ordered are listed, but only abnormal results are displayed) Labs Reviewed - No data to display  EKG None  Radiology No results found.  Procedures Procedures (including critical care time)  Medications Ordered in UC Medications - No data to display  Initial Impression / Assessment and Plan / UC Course  I have reviewed the triage vital signs and the nursing notes.  Pertinent labs & imaging results that were available during my care of the patient were reviewed by me and considered in my medical decision making (see chart for details).     Most likely gastroenteritis.   Will try bentyl for symptoms to see if this helps.  Follow up as needed for continued or worsening symptoms  Final Clinical Impressions(s) / UC Diagnoses   Final diagnoses:  Abdominal cramping     Discharge Instructions     It was nice meeting you!!  We will give you some medication to help with abdominal cramping. Follow up as needed for continued or worsening symptoms     ED Prescriptions    Medication Sig Dispense Auth. Provider   dicyclomine (BENTYL) 20 MG tablet Take 1 tablet (20 mg total) by mouth 2 (two) times daily. 20 tablet Dahlia Byes A, NP     Controlled Substance Prescriptions [place] Controlled Substance Registry consulted? Not Applicable   Janace Aris, NP 07/28/18 1755

## 2018-07-28 NOTE — ED Triage Notes (Signed)
Pt states he has stomach pain for a week.

## 2018-08-25 ENCOUNTER — Ambulatory Visit (HOSPITAL_COMMUNITY)
Admission: EM | Admit: 2018-08-25 | Discharge: 2018-08-25 | Disposition: A | Discharge status: Home / Self Care | Payer: BLUE CROSS/BLUE SHIELD | Attending: Family Medicine | Admitting: Family Medicine

## 2018-08-25 ENCOUNTER — Ambulatory Visit (INDEPENDENT_AMBULATORY_CARE_PROVIDER_SITE_OTHER): Payer: BLUE CROSS/BLUE SHIELD

## 2018-08-25 DIAGNOSIS — S63501A Unspecified sprain of right wrist, initial encounter: Secondary | ICD-10-CM

## 2018-08-25 MED ORDER — IBUPROFEN 800 MG PO TABS: 800.0000 mg | ORAL_TABLET | Freq: Three times a day (TID) | ORAL | 0 refills | Status: DC

## 2018-08-25 NOTE — ED Triage Notes (Signed)
Pt fell while skating and hurt his right wrist .x 3 days

## 2018-08-25 NOTE — ED Provider Notes (Signed)
[facility]    CSN: [id number] Arrival date & time: 08/25/18  1808     History   Chief Complaint Chief Complaint  Patient presents with  . Wrist Pain    HPI Michael Sutton is a 27 y.o. male.   HPI  Patient fell while rollerskating over the weekend.  He landed on his outstretched right wrist.  His right wrist is painful with limited range of motion.  He has been wearing a brace.  He was unable to go to work today.  He is not taking any medicine for pain.  He has no prior wrist problems.  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  No past medical history on file.  There are no active problems to display for this patient.   No past surgical history on file.     Home Medications    Prior to Admission medications   Medication Sig Start Date End Date Taking? Authorizing Provider  ibuprofen (ADVIL,MOTRIN) 800 MG tablet Take 1 tablet (800 mg total) by mouth 3 (three) times daily. 08/25/18   Eustace Moore, MD    Family History No family history on file. Patient does not know of any illness in his family, specifically denies hypertension, heart disease, cancer Social History Social History   Tobacco Use  . Smoking status: Current Every Day Smoker    Packs/day: 0.25    Types: Cigarettes  . Smokeless tobacco: Never Used  Substance Use Topics  . Alcohol use: Yes  . Drug use: No     Allergies   Patient has no known allergies.   Review of Systems Review of Systems  Musculoskeletal: Positive for arthralgias.     Physical Exam Triage Vital Signs ED Triage Vitals [08/25/18 1841]  Enc Vitals Group     BP 121/79     Pulse Rate 73     Resp 16     Temp 98.2 F (36.8 C)     Temp Source Oral     SpO2 100 %     Weight 164 lb (74.4 kg)     Height      Head Circumference      Peak Flow      Pain Score      Pain Loc      Pain Edu?      Excl. in GC?    No data found.  Updated Vital Signs BP 121/79 (BP Location: Right Arm)    Pulse 73   Temp 98.2 F (36.8 C) (Oral)   Resp 16   Wt 74.4 kg   SpO2 100%   BMI 22.24 kg/m   Visual Acuity Right Eye Distance:   Left Eye Distance:   Bilateral Distance:    Right Eye Near:   Left Eye Near:    Bilateral Near:     Physical Exam  Constitutional: He appears well-developed and well-nourished. No distress.  HENT:  Head: Normocephalic and atraumatic.  Mouth/Throat: Oropharynx is clear and moist.  Eyes: Pupils are equal, round, and reactive to light. Conjunctivae are normal.  Neck: Normal range of motion.  Cardiovascular: Normal rate.  Pulmonary/Chest: Effort normal. No respiratory distress.  Abdominal: Soft. He exhibits no distension.  Musculoskeletal: Normal range of motion. He exhibits no edema.  Right wrist examined.  No swelling noted.  Very limited range of motion.  Tenderness palpation over the distal radius and distal ulna, also over the anatomic snuffbox.  Range of motion is full but  slow.  Grip strength is impaired.  Sensation of the fingers is normal.  Neurological: He is alert.  Skin: Skin is warm and dry.     UC Treatments / Results  Labs (all labs ordered are listed, but only abnormal results are displayed) Labs Reviewed - No data to display  EKG None  Radiology Dg Wrist Complete Right  Result Date: 08/25/2018 CLINICAL DATA:  Larey Seat while skating landing on RIGHT hand, RIGHT wrist injury, injury occurred Saturday, pain at RIGHT wrist at distal ulna and distal radius EXAM: RIGHT WRIST - COMPLETE 3+ VIEW COMPARISON:  05/31/2017 FINDINGS: Osseous mineralization normal. Joint spaces preserved. No acute fracture, dislocation, or bone destruction. Ulnar minus variance noted. IMPRESSION: No acute osseous abnormalities. Ulnar minus variance. Electronically Signed   By: Ulyses Southward M.D.   On: 08/25/2018 19:05    Procedures Procedures (including critical care time)  Medications Ordered in UC Medications - No data to display  Initial Impression /  Assessment and Plan / UC Course  I have reviewed the triage vital signs and the nursing notes.  Pertinent labs & imaging results that were available during my care of the patient were reviewed by me and considered in my medical decision making (see chart for details).     Discussed wrist sprain.  Management.  Expectations for recovery.  Reasons for return Final Clinical Impressions(s) / UC Diagnoses   Final diagnoses:  Sprain of right wrist, initial encounter     Discharge Instructions     Take ibuprofen 3 x a day with food Wear brace for a week or 2 See orthopedic if you fail to improve   ED Prescriptions    Medication Sig Dispense Auth. Provider   ibuprofen (ADVIL,MOTRIN) 800 MG tablet Take 1 tablet (800 mg total) by mouth 3 (three) times daily. 21 tablet Eustace Moore, MD     Controlled Substance Prescriptions  Controlled Substance Registry consulted? Not Applicable   Eustace Moore, MD 08/25/18 2103

## 2018-08-25 NOTE — Discharge Instructions (Signed)
Take ibuprofen 3 x a day with food Wear brace for a week or 2 See orthopedic if you fail to improve

## 2018-11-16 ENCOUNTER — Other Ambulatory Visit: Payer: Self-pay

## 2018-11-16 ENCOUNTER — Ambulatory Visit (HOSPITAL_COMMUNITY)
Admission: EM | Admit: 2018-11-16 | Discharge: 2018-11-16 | Disposition: A | Discharge status: Home / Self Care | Payer: BLUE CROSS/BLUE SHIELD | Attending: Family Medicine | Admitting: Family Medicine

## 2018-11-16 ENCOUNTER — Encounter (HOSPITAL_COMMUNITY): Payer: Self-pay | Admitting: *Deleted

## 2018-11-16 DIAGNOSIS — G43009 Migraine without aura, not intractable, without status migrainosus: Secondary | ICD-10-CM

## 2018-11-16 MED ORDER — DEXAMETHASONE SODIUM PHOSPHATE 10 MG/ML IJ SOLN
INTRAMUSCULAR | Status: AC
Start: 2018-11-16 — End: ?
  Filled 2018-11-16: qty 1

## 2018-11-16 MED ORDER — KETOROLAC TROMETHAMINE 60 MG/2ML IM SOLN: 60.0000 mg | Freq: Once | INTRAMUSCULAR | Status: DC

## 2018-11-16 MED ORDER — METOCLOPRAMIDE HCL 5 MG/ML IJ SOLN: 5.0000 mg | Freq: Once | INTRAMUSCULAR | Status: DC

## 2018-11-16 MED ORDER — NAPROXEN 500 MG PO TABS: 500.0000 mg | ORAL_TABLET | Freq: Two times a day (BID) | ORAL | 0 refills | Status: DC

## 2018-11-16 MED ORDER — METOCLOPRAMIDE HCL 5 MG/ML IJ SOLN
INTRAMUSCULAR | Status: AC
Start: 2018-11-16 — End: ?
  Filled 2018-11-16: qty 2

## 2018-11-16 MED ORDER — KETOROLAC TROMETHAMINE 60 MG/2ML IM SOLN
INTRAMUSCULAR | Status: AC
  Filled 2018-11-16: qty 2

## 2018-11-16 MED ORDER — DEXAMETHASONE SODIUM PHOSPHATE 10 MG/ML IJ SOLN: 10.0000 mg | Freq: Once | INTRAMUSCULAR | Status: DC

## 2018-11-16 NOTE — Discharge Instructions (Signed)
We gave you a shot of Toradol, Decadron and Reglan today to treat your headache Please continue to use Naprosyn twice daily for further headache management at home, please avoid on your stomach when you take this  Please continue to monitor your symptoms, if developing worsening headache,, vision changes, weakness, dizziness, lightheadedness, neck stiffness or fever please follow-up in emergency room.  Please also follow-up if developing worsening abdominal pain, persistent vomiting or symptoms not returning back to normal.

## 2018-11-16 NOTE — ED Triage Notes (Signed)
C/O intermittent HA x 3 days; unsure if fevers.  Denies n/v.

## 2018-11-17 NOTE — ED Provider Notes (Signed)
[facility]    CSN: [id number] Arrival date & time: 11/16/18  1729     History   Chief Complaint Chief Complaint  Patient presents with  . Headache    HPI Averi C Ivie is a 27 y.o. male no contributing past medical history presenting today for evaluation of a headache.  Patient states that he has had a headache for the past 3 days.  Headache is mainly located on the right side near his temporal region.  Feels a throbbing sensation throughout the day.  He has had subjective fevers at night sweats and feeling drowsiness at nighttime.  He had one episode of vomiting and has had some occasional nausea.  Denies abdominal pain or diarrhea.  Denies change in bowel movements.  Denies URI symptoms of congestion, cough or sore throat.  He has not attempted anything over-the-counter.  Denies history of headaches.  Onset was gradual, headache has not progressively worsened since setting in.  Denies neck stiffness or difficulty moving neck.  HPI  History reviewed. No pertinent past medical history.  There are no active problems to display for this patient.   History reviewed. No pertinent surgical history.     Home Medications    Prior to Admission medications   Medication Sig Start Date End Date Taking? Authorizing Provider  ibuprofen (ADVIL,MOTRIN) 800 MG tablet Take 1 tablet (800 mg total) by mouth 3 (three) times daily. 08/25/18   Eustace MooreNelson, Yvonne Sue, MD  naproxen (NAPROSYN) 500 MG tablet Take 1 tablet (500 mg total) by mouth 2 (two) times daily. With food 11/16/18   Artemis Loyal, Junius CreamerHallie C, PA-C    Family History History reviewed. No pertinent family history.  Social History Social History   Tobacco Use  . Smoking status: Current Every Day Smoker    Packs/day: 0.25    Types: Cigarettes  . Smokeless tobacco: Never Used  Substance Use Topics  . Alcohol use: Not Currently  . Drug use: No     Allergies   Patient has no known allergies.   Review of  Systems Review of Systems  Constitutional: Positive for chills and fever. Negative for activity change, appetite change and fatigue.  HENT: Negative for congestion, sinus pressure and sore throat.   Eyes: Negative for photophobia, pain and visual disturbance.  Respiratory: Negative for cough and shortness of breath.   Cardiovascular: Negative for chest pain.  Gastrointestinal: Positive for nausea and vomiting. Negative for abdominal pain.  Genitourinary: Negative for decreased urine volume and hematuria.  Musculoskeletal: Negative for myalgias, neck pain and neck stiffness.  Neurological: Positive for headaches. Negative for dizziness, syncope, facial asymmetry, speech difficulty, weakness, light-headedness and numbness.     Physical Exam Triage Vital Signs ED Triage Vitals  Enc Vitals Group     BP 11/16/18 1826 119/66     Pulse Rate 11/16/18 1825 79     Resp 11/16/18 1825 16     Temp 11/16/18 1825 97.7 F (36.5 C)     Temp Source 11/16/18 1825 Oral     SpO2 11/16/18 1825 100 %     Weight --      Height --      Head Circumference --      Peak Flow --      Pain Score 11/16/18 1825 7     Pain Loc --      Pain Edu? --      Excl. in GC? --    No data found.  Updated Vital Signs BP  119/66   Pulse 79   Temp 97.7 F (36.5 C) (Oral)   Resp 16   SpO2 100%   Visual Acuity Right Eye Distance:   Left Eye Distance:   Bilateral Distance:    Right Eye Near:   Left Eye Near:    Bilateral Near:     Physical Exam Vitals signs and nursing note reviewed.  Constitutional:      Appearance: He is well-developed.  HENT:     Head: Normocephalic and atraumatic.     Ears:     Comments: Bilateral ears without tenderness to palpation of external auricle, tragus and mastoid, EAC's without erythema or swelling, TM's with good bony landmarks and cone of light. Non erythematous.    Mouth/Throat:     Comments: Oral mucosa pink and moist, no tonsillar enlargement or exudate. Posterior  pharynx patent and nonerythematous, no uvula deviation or swelling. Normal phonation. Eyes:     Extraocular Movements: Extraocular movements intact.     Conjunctiva/sclera: Conjunctivae normal.     Pupils: Pupils are equal, round, and reactive to light.  Neck:     Musculoskeletal: Neck supple.  Cardiovascular:     Rate and Rhythm: Normal rate and regular rhythm.     Heart sounds: No murmur.  Pulmonary:     Effort: Pulmonary effort is normal. No respiratory distress.     Breath sounds: Normal breath sounds.     Comments: Breathing comfortably at rest, CTABL, no wheezing, rales or other adventitious sounds auscultated  Abdominal:     Palpations: Abdomen is soft.     Tenderness: There is no abdominal tenderness.     Comments: nontender to light and deep palpation throughout abdomen  Skin:    General: Skin is warm and dry.  Neurological:     General: No focal deficit present.     Mental Status: He is alert and oriented to person, place, and time. Mental status is at baseline.     Comments: Patient A&O x3, cranial nerves II-XII grossly intact, strength at shoulders, hips and knees 5/5, equal bilaterally, patellar reflex 2+ bilaterally. Gait without abnormality.      UC Treatments / Results  Labs (all labs ordered are listed, but only abnormal results are displayed) Labs Reviewed - No data to display  EKG None  Radiology No results found.  Procedures Procedures (including critical care time)  Medications Ordered in UC Medications - No data to display  Initial Impression / Assessment and Plan / UC Course  I have reviewed the triage vital signs and the nursing notes.  Pertinent labs & imaging results that were available during my care of the patient were reviewed by me and considered in my medical decision making (see chart for details).     Headache for 3 days, seems more migraine-like versus tension headache.  No neuro deficits.  No nuchal rigidity.  Vital signs stable.   Offered injections of Toradol, Decadron and Reglan, patient initially agreed to this, but then he declined later on.  Added Naprosyn for headache management at home.  Discussed with patient prior to discharge given he does not have a history of headaches and continue to monitor symptoms and to follow-up in emergency room if having worsening symptoms, worsening headache, vision changes, persistent vomiting, neck stiffness or neck pain, lightheadedness, dizziness.Discussed strict return precautions. Patient verbalized understanding and is agreeable with plan.  Final Clinical Impressions(s) / UC Diagnoses   Final diagnoses:  Migraine without aura and without status migrainosus, not intractable  Discharge Instructions     We gave you a shot of Toradol, Decadron and Reglan today to treat your headache Please continue to use Naprosyn twice daily for further headache management at home, please avoid on your stomach when you take this  Please continue to monitor your symptoms, if developing worsening headache,, vision changes, weakness, dizziness, lightheadedness, neck stiffness or fever please follow-up in emergency room.  Please also follow-up if developing worsening abdominal pain, persistent vomiting or symptoms not returning back to normal.    ED Prescriptions    Medication Sig Dispense Auth. Provider   naproxen (NAPROSYN) 500 MG tablet Take 1 tablet (500 mg total) by mouth 2 (two) times daily. With food 30 tablet Greogry Goodwyn, Marshall C, PA-C     Controlled Substance Prescriptions [place] Controlled Substance Registry consulted? Not Applicable   Lew Dawes, [place] 11/17/18 2115

## 2019-11-03 IMAGING — DX DG SHOULDER 2+V*L*
3 series · 3 of 3 positions shown · non-contrast
Comparison: None.

CLINICAL DATA: Progressive shoulder pain since falling 2 weeks ago.
Initial encounter.

EXAM:
LEFT SHOULDER - 2+ VIEW

[shoulder grashey]
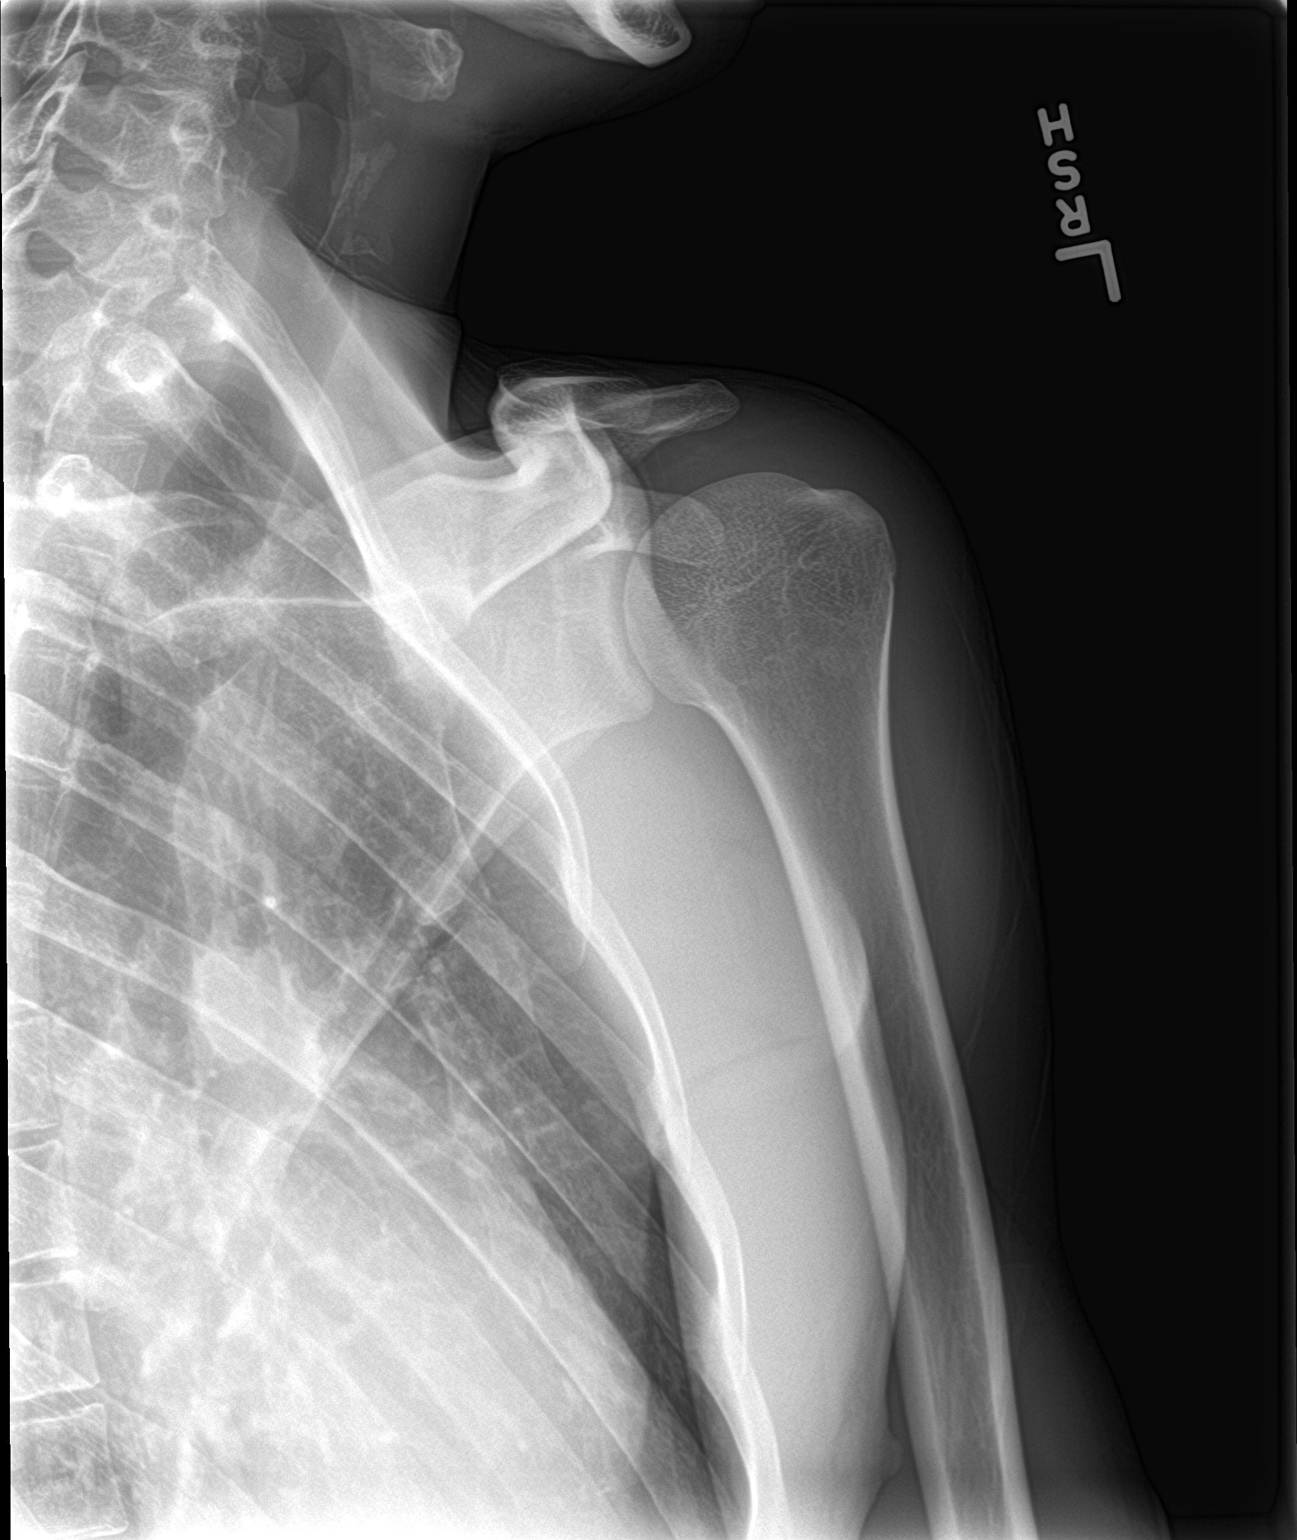

[shoulder y view]
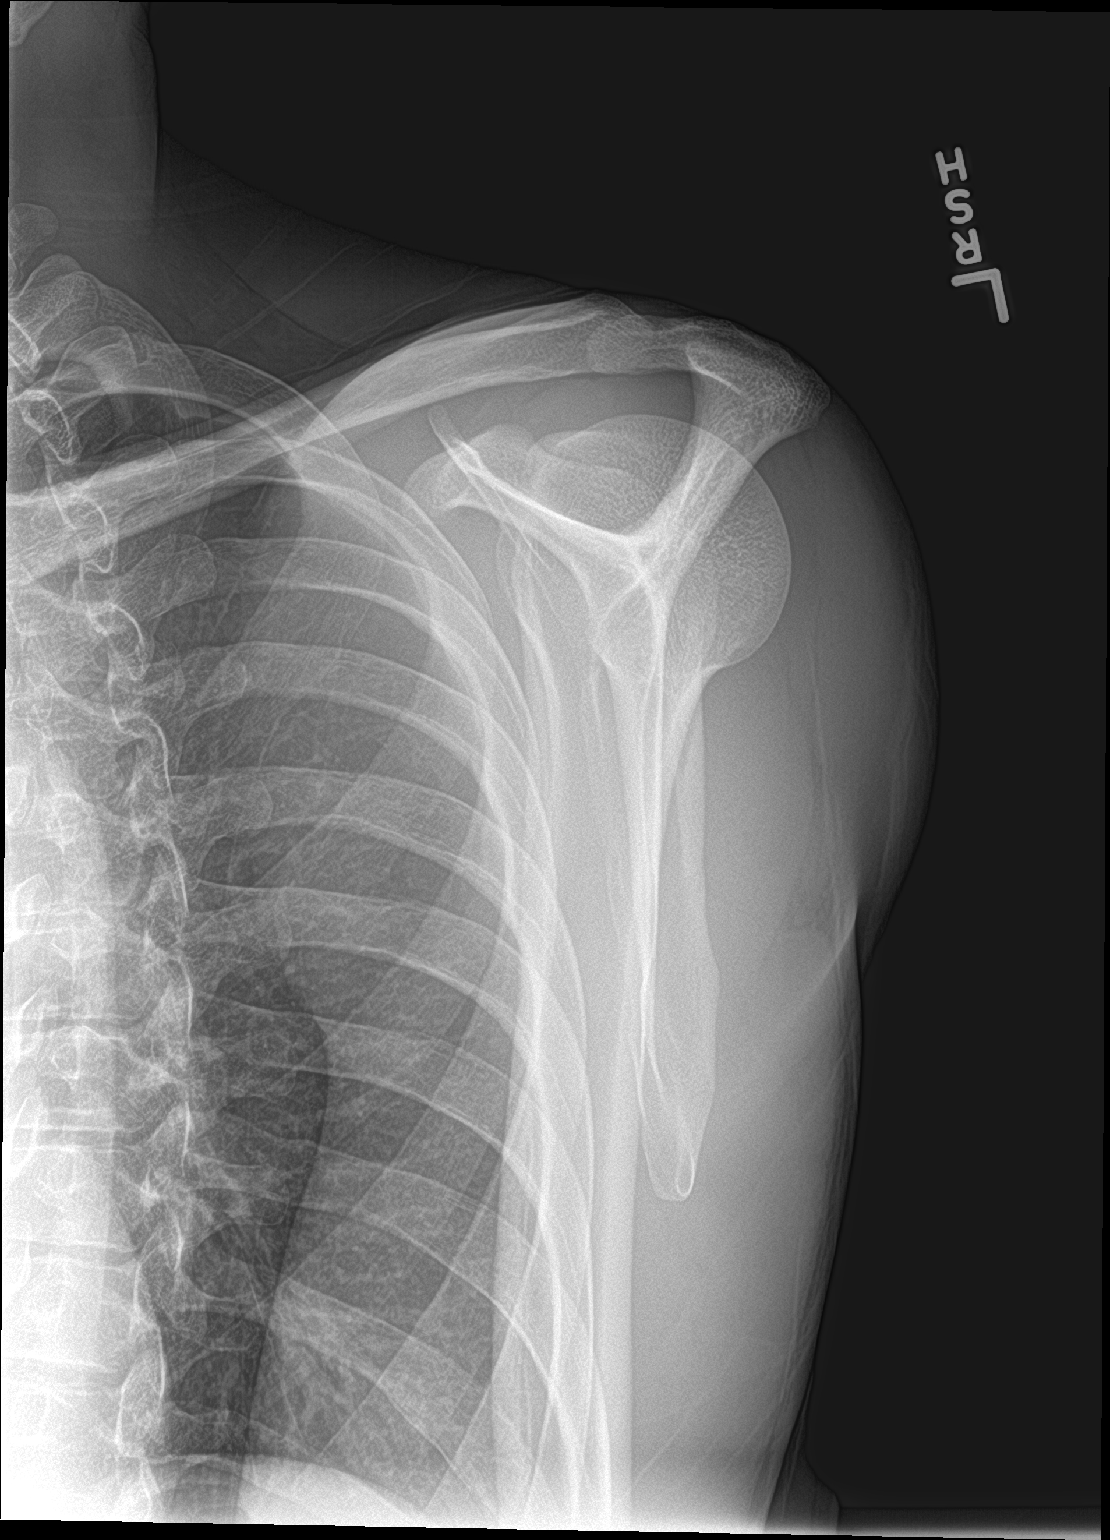

[shoulder axillary]
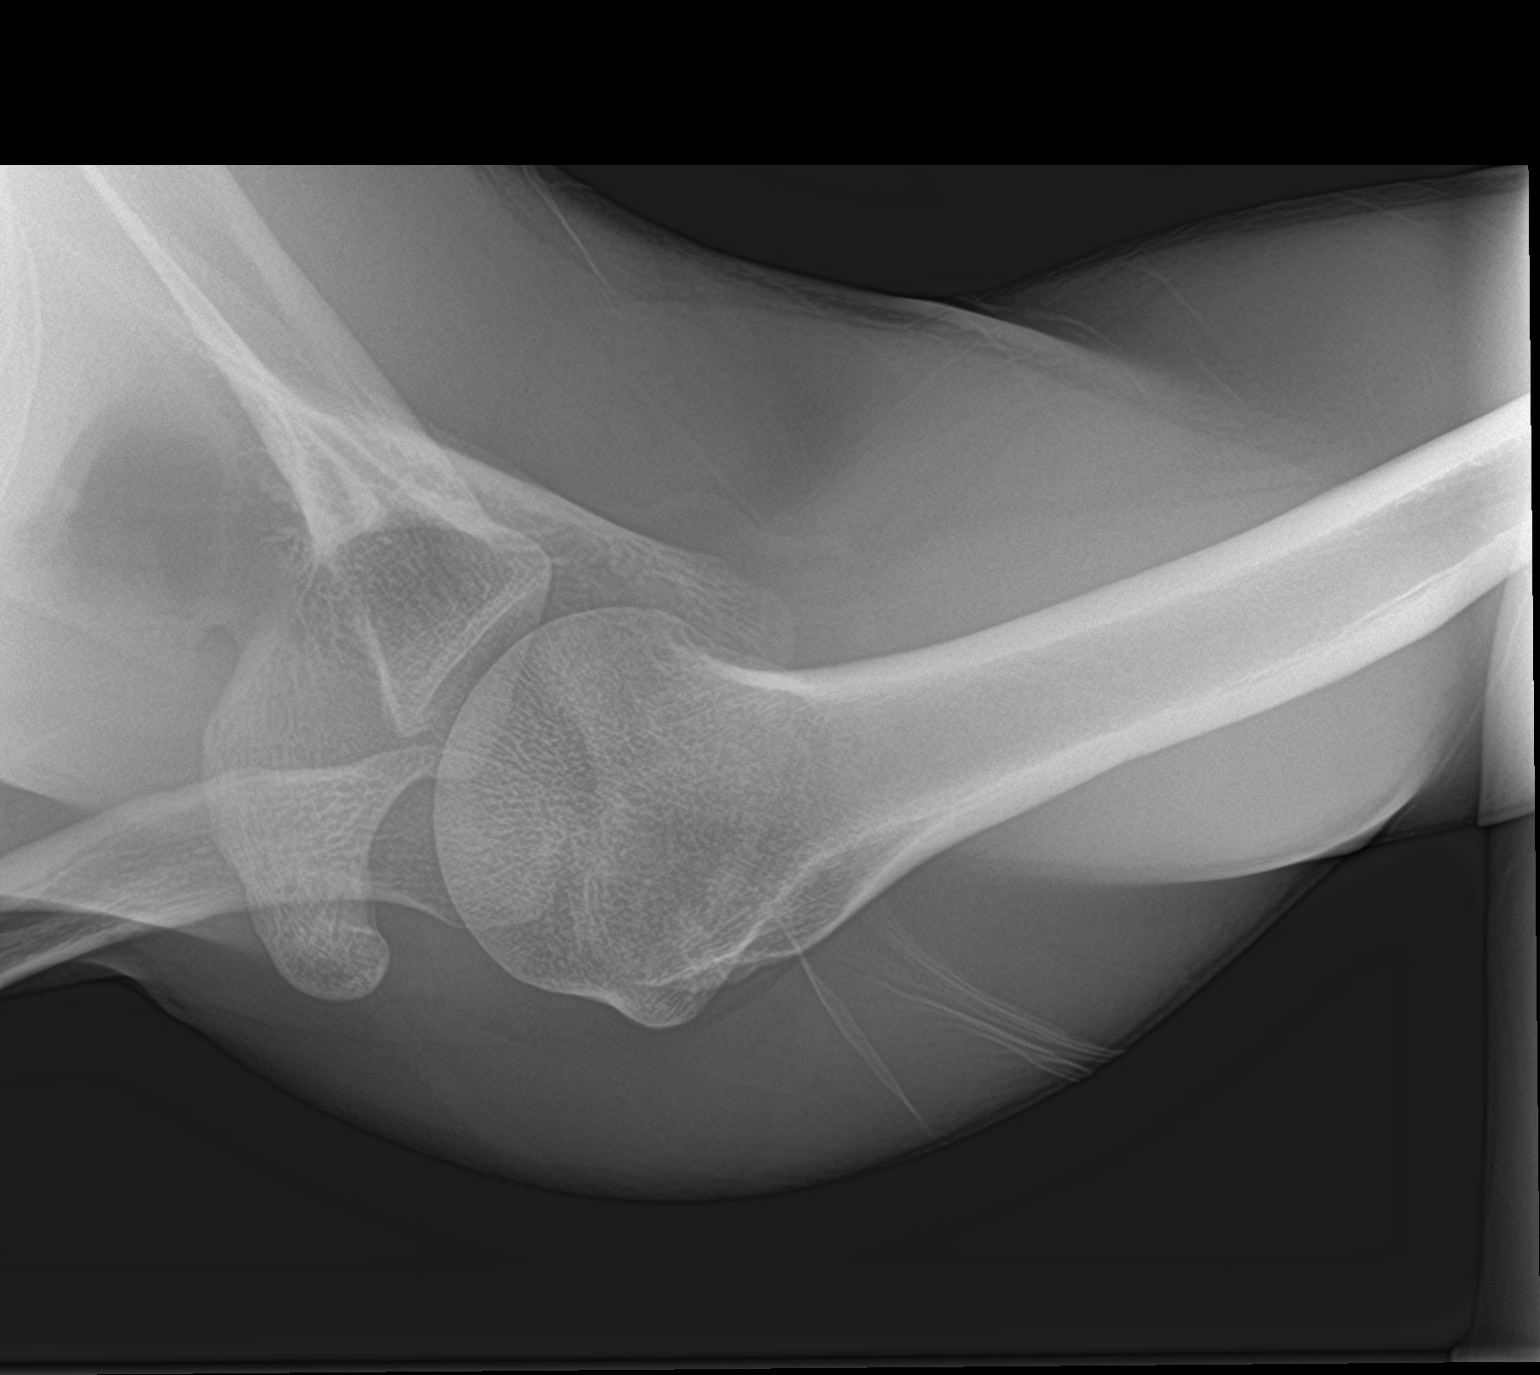

[3 of 3 positions shown; findings below may reference images not displayed]

FINDINGS: The mineralization and alignment are normal. There is no evidence of
acute fracture or dislocation. The subacromial space is preserved.
No significant arthropathic changes.
IMPRESSION: No evidence of acute left shoulder injury.

## 2020-11-29 ENCOUNTER — Ambulatory Visit (HOSPITAL_COMMUNITY)
Admission: EM | Admit: 2020-11-29 | Discharge: 2020-11-29 | Disposition: A | Discharge status: Home / Self Care | Payer: Self-pay | Attending: Family Medicine | Admitting: Family Medicine

## 2020-11-29 ENCOUNTER — Other Ambulatory Visit: Payer: Self-pay

## 2020-11-29 ENCOUNTER — Encounter (HOSPITAL_COMMUNITY): Payer: Self-pay

## 2020-11-29 DIAGNOSIS — R369 Urethral discharge, unspecified: Secondary | ICD-10-CM | POA: Insufficient documentation

## 2020-11-29 DIAGNOSIS — Z711 Person with feared health complaint in whom no diagnosis is made: Secondary | ICD-10-CM | POA: Insufficient documentation

## 2020-11-29 LAB — POCT URINALYSIS DIPSTICK, ED / UC
Bilirubin Urine: NEGATIVE
Glucose, UA: NEGATIVE mg/dL
Ketones, ur: NEGATIVE mg/dL
Nitrite: NEGATIVE
Protein, ur: NEGATIVE mg/dL
Specific Gravity, Urine: >=1.03 (ref 1.005–1.030)
Urobilinogen, UA: 1 mg/dL (ref 0.0–1.0)
pH: 7 (ref 5.0–8.0)

## 2020-11-29 MED ORDER — DOXYCYCLINE HYCLATE 100 MG PO CAPS: 100.0000 mg | ORAL_CAPSULE | Freq: Two times a day (BID) | ORAL | 0 refills | Status: DC

## 2020-11-29 MED ORDER — LIDOCAINE HCL (PF) 1 % IJ SOLN
INTRAMUSCULAR | Status: AC
  Filled 2020-11-29: qty 2

## 2020-11-29 MED ORDER — CEFTRIAXONE SODIUM 500 MG IJ SOLR
500.0000 mg | Freq: Once | INTRAMUSCULAR | Status: AC
  Administered 2020-11-29: 500 mg via INTRAMUSCULAR

## 2020-11-29 MED ORDER — CEFTRIAXONE SODIUM 500 MG IJ SOLR
INTRAMUSCULAR | Status: AC
  Filled 2020-11-29: qty 500

## 2020-11-29 NOTE — Discharge Instructions (Addendum)

## 2020-11-29 NOTE — ED Triage Notes (Signed)
Patient states he has had a greenish penile discharge x 2 weeks. Pt states he also may be "seeping" as he is walking around. Pt is aox4 and ambulatory.

## 2020-11-29 NOTE — ED Provider Notes (Signed)
[facility]   [id number] 11/29/20 Arrival Time: 0934  ASSESSMENT & PLAN:  1. Penile discharge   2. Concern about STD in male without diagnosis       Discharge Instructions     You have been given the following today for treatment of suspected gonorrhea and/or chlamydia:  cefTRIAXone (ROCEPHIN) injection 500 mg  Please pick up your prescription for doxycycline 100 mg and begin taking twice daily for the next seven (7) days.  Even though we have treated you today, we have sent testing for sexually transmitted infections. We will notify you of any positive results once they are received. If required, we will prescribe any medications you might need.  Please refrain from all sexual activity for at least the next seven days.     Pending: Labs Reviewed  POCT URINALYSIS DIPSTICK, ED / UC - Abnormal; Notable for the following components:      Result Value   Hgb urine dipstick TRACE (*)    Leukocytes,Ua SMALL (*)    All other components within normal limits    Will notify of any positive results. Instructed to refrain from sexual activity for at least seven days.  Reviewed expectations re: course of current medical issues. Questions answered. Outlined signs and symptoms indicating need for more acute intervention. Patient verbalized understanding. After Visit Summary given.   SUBJECTIVE:  Michael Sutton is a 30 y.o. male who presents with complaint of penile discharge. Onset gradual. First noticed this week. Describes discharge as thick and opaque. No specific aggravating or alleviating factors reported. Denies: urinary frequency, dysuria and gross hematuria. Afebrile. No abdominal or pelvic pain. No n/v. No rashes or lesions. Reports that he is sexually active. History of STI: none reported.    OBJECTIVE:  Vitals:   11/29/20 1023  BP: 128/75  Pulse: 77  Resp: 18  Temp: 98.2 F (36.8 C)  TempSrc: Oral  SpO2: 100%     General appearance: alert,  cooperative, appears stated age and no distress Throat: lips, mucosa, and tongue normal; teeth and gums normal Lungs: unlabored respirations; speaks full sentences without difficulty Back: no CVA tenderness; FROM at waist Abdomen: soft, non-tender GU: normal appearing genitalia Skin: warm and dry Psychological: alert and cooperative; normal mood and affect.  Results for orders placed or performed during the hospital encounter of 11/29/20  POCT Urinalysis Dipstick (ED/UC)  Result Value Ref Range   Glucose, UA NEGATIVE NEGATIVE mg/dL   Bilirubin Urine NEGATIVE NEGATIVE   Ketones, ur NEGATIVE NEGATIVE mg/dL   Specific Gravity, Urine >=1.030 1.005 - 1.030   Hgb urine dipstick TRACE (A) NEGATIVE   pH 7.0 5.0 - 8.0   Protein, ur NEGATIVE NEGATIVE mg/dL   Urobilinogen, UA 1.0 0.0 - 1.0 mg/dL   Nitrite NEGATIVE NEGATIVE   Leukocytes,Ua SMALL (A) NEGATIVE    Labs Reviewed  POCT URINALYSIS DIPSTICK, ED / UC - Abnormal; Notable for the following components:      Result Value   Hgb urine dipstick TRACE (*)    Leukocytes,Ua SMALL (*)    All other components within normal limits    No Known Allergies  History reviewed. No pertinent past medical history. History reviewed. No pertinent family history. Social History   Socioeconomic History  . Marital status: Single    Spouse name: Not on file  . Number of children: Not on file  . Years of education: Not on file  . Highest education level: Not on file  Occupational History  . Not  on file  Tobacco Use  . Smoking status: Current Every Day Smoker    Packs/day: 0.25    Types: Cigarettes  . Smokeless tobacco: Never Used  Vaping Use  . Vaping Use: Never used  Substance and Sexual Activity  . Alcohol use: Not Currently  . Drug use: No  . Sexual activity: Yes  Other Topics Concern  . Not on file  Social History Narrative  . Not on file   Social Determinants of Health   Financial Resource Strain: Not on file  Food Insecurity:  Not on file  Transportation Needs: Not on file  Physical Activity: Not on file  Stress: Not on file  Social Connections: Not on file  Intimate Partner Violence: Not on file          Mardella Layman, MD 11/29/20 1140

## 2020-12-01 LAB — CYTOLOGY, (ORAL, ANAL, URETHRAL) ANCILLARY ONLY
Chlamydia: NEGATIVE
Comment: NEGATIVE
Comment: NEGATIVE
Comment: NORMAL
Neisseria Gonorrhea: POSITIVE — AB
Trichomonas: NEGATIVE

## 2020-12-02 ENCOUNTER — Telehealth: Payer: Self-pay

## 2020-12-02 NOTE — Telephone Encounter (Signed)
Gonorrhea is positive.  Please return as soon as possible to the urgent care for treatment with 500mg  IM rocephin. Patient will not need to see a provider unless there are new symptoms you would like evaluated. Please refrain from sexual intercourse for now and for 7 days after treatment to give the medicine time to work. Sexual partners need to be notified and tested/treated. Condoms may reduce risk of reinfection. [facility] notified.

## 2020-12-13 ENCOUNTER — Telehealth (HOSPITAL_COMMUNITY): Payer: Self-pay | Admitting: Emergency Medicine

## 2020-12-13 NOTE — Telephone Encounter (Signed)
Patient returned call after receiving letter.  Verified identity using two identifiers.  Provided positive result.  Reviewed safe sex practices, notifying partners, and refraining from sexual activities for 7 days from time of treatment.  Patient verified understanding of need to return for treatment

## 2021-06-28 ENCOUNTER — Ambulatory Visit (HOSPITAL_COMMUNITY)
Admission: EM | Admit: 2021-06-28 | Discharge: 2021-06-28 | Disposition: A | Discharge status: Home / Self Care | Payer: Self-pay | Attending: Internal Medicine | Admitting: Internal Medicine

## 2021-06-28 ENCOUNTER — Other Ambulatory Visit: Payer: Self-pay

## 2021-06-28 DIAGNOSIS — Z20822 Contact with and (suspected) exposure to covid-19: Secondary | ICD-10-CM | POA: Insufficient documentation

## 2021-06-28 LAB — SARS CORONAVIRUS 2 (TAT 6-24 HRS): SARS Coronavirus 2: NEGATIVE

## 2021-06-28 NOTE — ED Triage Notes (Signed)
Pt presents for COVID testing. Denies sxs.  °

## 2021-08-15 ENCOUNTER — Emergency Department (HOSPITAL_COMMUNITY): Payer: Self-pay

## 2021-08-15 ENCOUNTER — Emergency Department (HOSPITAL_COMMUNITY)
Admission: EM | Admit: 2021-08-15 | Discharge: 2021-08-15 | Disposition: A | Discharge status: Home / Self Care | Payer: Self-pay | Attending: Student | Admitting: Student

## 2021-08-15 ENCOUNTER — Encounter (HOSPITAL_COMMUNITY): Payer: Self-pay

## 2021-08-15 DIAGNOSIS — F17210 Nicotine dependence, cigarettes, uncomplicated: Secondary | ICD-10-CM | POA: Insufficient documentation

## 2021-08-15 DIAGNOSIS — S01511A Laceration without foreign body of lip, initial encounter: Secondary | ICD-10-CM | POA: Insufficient documentation

## 2021-08-15 DIAGNOSIS — R0781 Pleurodynia: Secondary | ICD-10-CM | POA: Insufficient documentation

## 2021-08-15 NOTE — Discharge Instructions (Signed)
The CT scan of your head did not show any significant traumatic injuries and the x-ray of your chest did not show any abnormalities with your lungs or any obvious rib fractures.  Follow the instructions for wound care provided on your discharge paperwork.  Use Tylenol and Motrin for pain.  Follow-up with your regular doctor and return to the emergency department for any new or worsening symptoms.
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
## 2022-08-23 ENCOUNTER — Other Ambulatory Visit: Payer: Self-pay

## 2022-08-23 ENCOUNTER — Encounter (HOSPITAL_COMMUNITY): Payer: Self-pay | Admitting: Emergency Medicine

## 2022-08-23 ENCOUNTER — Ambulatory Visit (HOSPITAL_COMMUNITY)
Admission: EM | Admit: 2022-08-23 | Discharge: 2022-08-23 | Disposition: A | Discharge status: Home / Self Care | Payer: Commercial Managed Care - HMO | Attending: Family Medicine | Admitting: Family Medicine

## 2022-08-23 ENCOUNTER — Ambulatory Visit (HOSPITAL_COMMUNITY): Admit: 2022-08-23 | Payer: Self-pay

## 2022-08-23 DIAGNOSIS — J020 Streptococcal pharyngitis: Secondary | ICD-10-CM | POA: Diagnosis not present

## 2022-08-23 LAB — POCT RAPID STREP A, ED / UC: Streptococcus, Group A Screen (Direct): POSITIVE — AB

## 2022-08-23 MED ORDER — KETOROLAC TROMETHAMINE 30 MG/ML IJ SOLN
INTRAMUSCULAR | Status: AC
  Filled 2022-08-23: qty 1

## 2022-08-23 MED ORDER — KETOROLAC TROMETHAMINE 30 MG/ML IJ SOLN
30.0000 mg | Freq: Once | INTRAMUSCULAR | Status: AC
  Administered 2022-08-23: 30 mg via INTRAMUSCULAR

## 2022-08-23 MED ORDER — IBUPROFEN 800 MG PO TABS: 800.0000 mg | ORAL_TABLET | Freq: Three times a day (TID) | ORAL | 0 refills | Status: DC | PRN

## 2022-08-23 MED ORDER — AMOXICILLIN 875 MG PO TABS: 875.0000 mg | ORAL_TABLET | Freq: Two times a day (BID) | ORAL | 0 refills | Status: AC

## 2022-08-23 NOTE — Discharge Instructions (Addendum)
Strep test is positive 2 days into taking the antibiotics, throw away the toothbrush and begin using a new one. Patient is not contagious after 24 hours of antibiotics; a full 10 days should be completed to prevent rheumatic fever  You have been given a shot of Toradol 30 mg today.  Take amoxicillin 875 mg--1 tab twice daily for 7 days  Take ibuprofen 800 mg--1 tab every 8 hours as needed for pain.

## 2022-08-23 NOTE — ED Provider Notes (Signed)
[facility]    CSN: [id number] Arrival date & time: 08/23/22  3299      History   Chief Complaint Chief Complaint  Patient presents with   Sore Throat    HPI Michael Sutton is a 31 y.o. male.    Sore Throat   Here for sore throat that began October 1.  He has had some postnasal drainage, but no cough.  No fever or chills noted, but he does have some sweats when he sleeping.  He has had some loose stools maybe 3 or 4 times a day.  No blood in his stool, but he has had a little blood from his throat.  Home COVID test was negative yesterday    History reviewed. No pertinent past medical history.  There are no problems to display for this patient.   History reviewed. No pertinent surgical history.     Home Medications    Prior to Admission medications   Medication Sig Start Date End Date Taking? Authorizing Provider  amoxicillin (AMOXIL) 875 MG tablet Take 1 tablet (875 mg total) by mouth 2 (two) times daily for 7 days. 08/23/22 08/30/22 Yes Barrett Henle, MD  ibuprofen (ADVIL) 800 MG tablet Take 1 tablet (800 mg total) by mouth every 8 (eight) hours as needed (pain). 08/23/22  Yes Barrett Henle, MD    Family History History reviewed. No pertinent family history.  Social History Social History   Tobacco Use   Smoking status: Every Day    Packs/day: 0.25    Types: Cigarettes   Smokeless tobacco: Never  Vaping Use   Vaping Use: Never used  Substance Use Topics   Alcohol use: Yes   Drug use: No     Allergies   Patient has no known allergies.   Review of Systems Review of Systems   Physical Exam Triage Vital Signs ED Triage Vitals  Enc Vitals Group     BP 08/23/22 1040 124/88     Pulse Rate 08/23/22 1040 90     Resp 08/23/22 1040 (!) 22     Temp 08/23/22 1040 98.8 F (37.1 C)     Temp Source 08/23/22 1040 Oral     SpO2 08/23/22 1040 97 %     Weight --      Height --      Head Circumference --      Peak Flow --       Pain Score 08/23/22 1037 9     Pain Loc --      Pain Edu? --      Excl. in [place]? --    No data found.  Updated Vital Signs BP 124/88 (BP Location: Right Arm)   Pulse 90   Temp 98.8 F (37.1 C) (Oral)   Resp (!) 22   SpO2 97%   Visual Acuity Right Eye Distance:   Left Eye Distance:   Bilateral Distance:    Right Eye Near:   Left Eye Near:    Bilateral Near:     Physical Exam Vitals reviewed.  Constitutional:      General: He is not in acute distress.    Appearance: He is not toxic-appearing.  HENT:     Right Ear: Tympanic membrane and ear canal normal.     Left Ear: Tympanic membrane and ear canal normal.     Nose: Nose normal.     Mouth/Throat:     Mouth: Mucous membranes are moist.     Comments:  There is erythema and tonsillar hypertrophy bilaterally.  No asymmetry Eyes:     Extraocular Movements: Extraocular movements intact.     Conjunctiva/sclera: Conjunctivae normal.     Pupils: Pupils are equal, round, and reactive to light.  Neck:     Comments: There is tender anterior cervical adenopathy on the right. Cardiovascular:     Rate and Rhythm: Normal rate and regular rhythm.     Heart sounds: No murmur heard. Pulmonary:     Effort: No respiratory distress.     Breath sounds: No stridor. No wheezing, rhonchi or rales.  Musculoskeletal:     Cervical back: Neck supple.  Skin:    Capillary Refill: Capillary refill takes less than 2 seconds.     Coloration: Skin is not jaundiced or pale.  Neurological:     General: No focal deficit present.     Mental Status: He is alert and oriented to person, place, and time.  Psychiatric:        Behavior: Behavior normal.      UC Treatments / Results  Labs (all labs ordered are listed, but only abnormal results are displayed) Labs Reviewed  POCT RAPID STREP A, ED / UC - Abnormal; Notable for the following components:      Result Value   Streptococcus, Group A Screen (Direct) POSITIVE (*)    All other components  within normal limits    EKG   Radiology No results found.  Procedures Procedures (including critical care time)  Medications Ordered in UC Medications  ketorolac (TORADOL) 30 MG/ML injection 30 mg (has no administration in time range)    Initial Impression / Assessment and Plan / UC Course  I have reviewed the triage vital signs and the nursing notes.  Pertinent labs & imaging results that were available during my care of the patient were reviewed by me and considered in my medical decision making (see chart for details).         Rapid strep is positive.  I will treat with amoxicillin and ibuprofen for pain.  He is given a shot of Toradol also for pain. Final Clinical Impressions(s) / UC Diagnoses   Final diagnoses:  Strep pharyngitis     Discharge Instructions      Strep test is positive 2 days into taking the antibiotics, throw away the toothbrush and begin using a new one. Patient is not contagious after 24 hours of antibiotics; a full 10 days should be completed to prevent rheumatic fever  You have been given a shot of Toradol 30 mg today.  Take amoxicillin 875 mg--1 tab twice daily for 7 days  Take ibuprofen 800 mg--1 tab every 8 hours as needed for pain.       ED Prescriptions     Medication Sig Dispense Auth. Provider   amoxicillin (AMOXIL) 875 MG tablet Take 1 tablet (875 mg total) by mouth 2 (two) times daily for 7 days. 14 tablet Oleg Oleson, Janace Aris, MD   ibuprofen (ADVIL) 800 MG tablet Take 1 tablet (800 mg total) by mouth every 8 (eight) hours as needed (pain). 21 tablet Alliana Mcauliff, Janace Aris, MD      PDMP not reviewed this encounter.   Zenia Resides, MD 08/23/22 1131

## 2022-08-23 NOTE — ED Triage Notes (Signed)
Symptoms for 4-5 day.  Patient complains of a sore throat, pain with swallowing, swelling to right side of neck.   Covid test yesterday, negative result Has taken tylenol and benadryl

## 2022-09-09 ENCOUNTER — Other Ambulatory Visit: Payer: Self-pay

## 2022-09-09 ENCOUNTER — Emergency Department (HOSPITAL_COMMUNITY)
Admission: EM | Admit: 2022-09-09 | Discharge: 2022-09-09 | Disposition: A | Discharge status: Home / Self Care | Payer: Commercial Managed Care - HMO | Attending: Emergency Medicine | Admitting: Emergency Medicine

## 2022-09-09 ENCOUNTER — Emergency Department (HOSPITAL_COMMUNITY): Payer: Commercial Managed Care - HMO

## 2022-09-09 ENCOUNTER — Encounter (HOSPITAL_COMMUNITY): Payer: Self-pay | Admitting: Emergency Medicine

## 2022-09-09 DIAGNOSIS — F10920 Alcohol use, unspecified with intoxication, uncomplicated: Secondary | ICD-10-CM

## 2022-09-09 DIAGNOSIS — M79621 Pain in right upper arm: Secondary | ICD-10-CM | POA: Diagnosis present

## 2022-09-09 DIAGNOSIS — S42392A Other fracture of shaft of left humerus, initial encounter for closed fracture: Secondary | ICD-10-CM | POA: Insufficient documentation

## 2022-09-09 DIAGNOSIS — W19XXXA Unspecified fall, initial encounter: Secondary | ICD-10-CM

## 2022-09-09 LAB — CBC WITH DIFFERENTIAL/PLATELET
Abs Immature Granulocytes: 0.1 10*3/uL — ABNORMAL HIGH (ref 0.00–0.07)
Basophils Absolute: 0.1 10*3/uL (ref 0.0–0.1)
Basophils Relative: 1 %
Eosinophils Absolute: 0.1 10*3/uL (ref 0.0–0.5)
Eosinophils Relative: 1 %
HCT: 42.1 % (ref 39.0–52.0)
Hemoglobin: 13.6 g/dL (ref 13.0–17.0)
Lymphocytes Relative: 46 %
Lymphs Abs: 3.3 10*3/uL (ref 0.7–4.0)
MCH: 28.5 pg (ref 26.0–34.0)
MCHC: 32.3 g/dL (ref 30.0–36.0)
MCV: 88.1 fL (ref 80.0–100.0)
Monocytes Absolute: 0.4 10*3/uL (ref 0.1–1.0)
Monocytes Relative: 5 %
Myelocytes: 1 %
Neutro Abs: 3.3 10*3/uL (ref 1.7–7.7)
Neutrophils Relative %: 46 %
Platelets: 308 10*3/uL (ref 150–400)
RBC: 4.78 MIL/uL (ref 4.22–5.81)
RDW: 12.9 % (ref 11.5–15.5)
WBC: 7.1 10*3/uL (ref 4.0–10.5)
nRBC: 0 % (ref 0.0–0.2)
nRBC: 0 /100 WBC

## 2022-09-09 LAB — COMPREHENSIVE METABOLIC PANEL
ALT: 21 U/L (ref 0–44)
AST: 38 U/L (ref 15–41)
Albumin: 3.9 g/dL (ref 3.5–5.0)
Alkaline Phosphatase: 54 U/L (ref 38–126)
Anion gap: 12 (ref 5–15)
BUN: 10 mg/dL (ref 6–20)
CO2: 23 mmol/L (ref 22–32)
Calcium: 8.6 mg/dL — ABNORMAL LOW (ref 8.9–10.3)
Chloride: 103 mmol/L (ref 98–111)
Creatinine, Ser: 1.01 mg/dL (ref 0.61–1.24)
GFR, Estimated: >60 mL/min (ref >60)
Glucose, Bld: 107 mg/dL — ABNORMAL HIGH (ref 70–99)
Potassium: 3.6 mmol/L (ref 3.5–5.1)
Sodium: 138 mmol/L (ref 135–145)
Total Bilirubin: 0.5 mg/dL (ref 0.3–1.2)
Total Protein: 7.6 g/dL (ref 6.5–8.1)

## 2022-09-09 LAB — RAPID URINE DRUG SCREEN, HOSP PERFORMED
Amphetamines: NOT DETECTED
Barbiturates: NOT DETECTED
Benzodiazepines: NOT DETECTED
Cocaine: POSITIVE — AB
Opiates: NOT DETECTED
Tetrahydrocannabinol: POSITIVE — AB

## 2022-09-09 LAB — ETHANOL: Alcohol, Ethyl (B): 287 mg/dL — ABNORMAL HIGH (ref <10)

## 2022-09-09 MED ORDER — OXYCODONE-ACETAMINOPHEN 5-325 MG PO TABS: 1.0000 | ORAL_TABLET | Freq: Four times a day (QID) | ORAL | 0 refills | Status: DC | PRN

## 2022-09-09 MED ORDER — HYDROMORPHONE HCL 1 MG/ML IJ SOLN
1.0000 mg | Freq: Once | INTRAMUSCULAR | Status: AC
  Administered 2022-09-09: 1 mg via INTRAMUSCULAR
  Filled 2022-09-09: qty 1

## 2022-09-09 NOTE — Progress Notes (Signed)
Orthopedic Tech Progress Note Patient Details:  Michael Sutton 1991-08-17 [id number]  Ortho Devices Type of Ortho Device: Coapt, Shoulder immobilizer Ortho Device/Splint Location: LUE Ortho Device/Splint Interventions: Ordered, Application, Adjustment   Post Interventions Patient Tolerated: Poor Instructions Provided: Care of device Splint applied with assistance from EMT. Vernona Rieger 09/09/2022, 12:52 PM
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
## 2022-09-09 NOTE — Discharge Instructions (Addendum)
It was our pleasure to provide your ER care today - we hope that you feel better.  Keep splint/sling clean/dry.   Take motrin or aleve as need for pain. You may also take percocet as need for pain. No driving for the next 6 hours or when taking percocet. Also, do not take tylenol or acetaminophen containing medication when taking percocet.   Follow up with orthopedist this week - call tomorrow morning to arrange appointment time.   Return to ER if worse, new symptoms, severe/intractable pain, numbness/weakness, or other concern.
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
## 2022-11-16 DIAGNOSIS — S42321D Displaced transverse fracture of shaft of humerus, right arm, subsequent encounter for fracture with routine healing: Secondary | ICD-10-CM | POA: Diagnosis not present

## 2022-11-22 ENCOUNTER — Telehealth: Payer: Self-pay

## 2022-11-22 NOTE — Telephone Encounter (Signed)
Mychart msg sent. AS, CMA 

## 2024-05-17 ENCOUNTER — Encounter (HOSPITAL_COMMUNITY): Payer: Self-pay

## 2024-05-17 ENCOUNTER — Ambulatory Visit (HOSPITAL_COMMUNITY)
Admission: EM | Admit: 2024-05-17 | Discharge: 2024-05-17 | Disposition: A | Discharge status: Home / Self Care | Attending: Nurse Practitioner | Admitting: Nurse Practitioner

## 2024-05-17 DIAGNOSIS — Z113 Encounter for screening for infections with a predominantly sexual mode of transmission: Secondary | ICD-10-CM | POA: Insufficient documentation

## 2024-05-17 DIAGNOSIS — R369 Urethral discharge, unspecified: Secondary | ICD-10-CM | POA: Insufficient documentation

## 2024-05-17 MED ORDER — CEFTRIAXONE SODIUM 500 MG IJ SOLR
INTRAMUSCULAR | Status: AC
  Filled 2024-05-17: qty 500

## 2024-05-17 MED ORDER — CEFTRIAXONE SODIUM 500 MG IJ SOLR
500.0000 mg | Freq: Once | INTRAMUSCULAR | Status: AC
  Administered 2024-05-17: 500 mg via INTRAMUSCULAR

## 2024-05-17 MED ORDER — AZITHROMYCIN 250 MG PO TABS
1000.0000 mg | ORAL_TABLET | Freq: Once | ORAL | Status: AC
  Administered 2024-05-17: 1000 mg via ORAL

## 2024-05-17 MED ORDER — AZITHROMYCIN 250 MG PO TABS
ORAL_TABLET | ORAL | Status: AC
  Filled 2024-05-17: qty 4

## 2024-05-17 NOTE — Discharge Instructions (Addendum)
 You were seen today for yellow discharge from the penis, which is likely due to a sexually transmitted infection such as gonorrhea or chlamydia. A swab was taken today to test for gonorrhea, chlamydia, and trichomonas. You were treated in the clinic with ceftriaxone  and azithromycin . While doxycycline  is typically preferred for chlamydia, azithromycin  was given due to concerns about you being able to complete a multi-day antibiotic course.  Avoid all sexual activity until your symptoms are gone and your test results are back. This helps prevent spreading infection and avoids re-infection. If your discharge continues after one week, you should be retested, even if your initial results were negative. Test results will be available through your MyChart account.  Drink plenty of fluids, and avoid using harsh soaps or other irritants on the genital area. Keep the area clean and dry.  Follow up with your primary care provider or local health department for further evaluation or treatment if symptoms persist or worsen.   Go to the emergency room if you develop fever, severe genital pain, painful or swollen testicles, difficulty urinating, or signs of a spreading infection such as redness, swelling, or drainage from the penis.

## 2024-05-17 NOTE — ED Triage Notes (Signed)
 Patient reports penile discharge that started today.

## 2024-05-17 NOTE — ED Provider Notes (Signed)
 [facility]    CSN: [id number] Arrival date & time: 05/17/24  1648      History   Chief Complaint No chief complaint on file.   HPI Michael Sutton is a 33 y.o. male.   Discussed the use of AI scribe software for clinical note transcription with the patient, who gave verbal consent to proceed.   Michael Sutton is a 33 y.o. male that presents for STD testing due to yellow, mucus-like discharge from his penis. The discharge began today. The patient denies pain or sores in his genitals, pain or burning with urination, abdominal pain, nausea, vomiting, back pain, or fever. The patient reports being sexually active. He has a girlfriend but engaged in a one-night stand with another partner about two weeks ago. He did not use protection with this encounter. He has not had sexual intercourse with his girlfriend since the encounter with the new partner.  The following portions of the patient's history were reviewed and updated as appropriate: allergies, current medications, past family history, past medical history, past social history, past surgical history, and problem list.    History reviewed. No pertinent past medical history.  There are no active problems to display for this patient.   History reviewed. No pertinent surgical history.     Home Medications    Prior to Admission medications   Not on File    Family History History reviewed. No pertinent family history.  Social History Social History   Tobacco Use   Smoking status: Every Day    Current packs/day: 0.25    Types: Cigarettes   Smokeless tobacco: Never  Vaping Use   Vaping status: Never Used  Substance Use Topics   Alcohol use: Yes   Drug use: No     Allergies   Patient has no known allergies.   Review of Systems Review of Systems  Constitutional:  Negative for fever.  Gastrointestinal:  Negative for abdominal pain, nausea and vomiting.  Genitourinary:  Positive for penile  discharge. Negative for dysuria, genital sores, penile pain, penile swelling, scrotal swelling and testicular pain.  Musculoskeletal:  Negative for back pain.  All other systems reviewed and are negative.    Physical Exam Triage Vital Signs ED Triage Vitals [05/17/24 1736]  Encounter Vitals Group     BP 115/79     Girls Systolic BP Percentile      Girls Diastolic BP Percentile      Boys Systolic BP Percentile      Boys Diastolic BP Percentile      Pulse Rate 93     Resp 18     Temp 98.2 F (36.8 C)     Temp Source Oral     SpO2 98 %     Weight      Height      Head Circumference      Peak Flow      Pain Score      Pain Loc      Pain Education      Exclude from Growth Chart    No data found.  Updated Vital Signs BP 115/79 (BP Location: Left Arm)   Pulse 93   Temp 98.2 F (36.8 C) (Oral)   Resp 18   SpO2 98%   Visual Acuity Right Eye Distance:   Left Eye Distance:   Bilateral Distance:    Right Eye Near:   Left Eye Near:    Bilateral Near:     Physical Exam  Constitutional:      General: He is not in acute distress.    Appearance: Normal appearance. He is not ill-appearing, toxic-appearing or diaphoretic.  HENT:     Head: Normocephalic.     Nose: Nose normal.     Mouth/Throat:     Mouth: Mucous membranes are moist.   Eyes:     Conjunctiva/sclera: Conjunctivae normal.    Cardiovascular:     Rate and Rhythm: Normal rate.  Pulmonary:     Effort: Pulmonary effort is normal.  Abdominal:     Palpations: Abdomen is soft.  Genitourinary:    Comments: Deferred; patient performed self-swab for Aptima testing   Musculoskeletal:        General: Normal range of motion.     Cervical back: Normal range of motion and neck supple.   Skin:    General: Skin is warm and dry.   Neurological:     General: No focal deficit present.     Mental Status: He is alert and oriented to person, place, and time.   Psychiatric:        Mood and Affect: Mood normal.         Behavior: Behavior normal.      UC Treatments / Results  Labs (all labs ordered are listed, but only abnormal results are displayed) Labs Reviewed  CYTOLOGY, (ORAL, ANAL, URETHRAL) ANCILLARY ONLY    EKG   Radiology No results found.  Procedures Procedures (including critical care time)  Medications Ordered in UC Medications  cefTRIAXone  (ROCEPHIN ) injection 500 mg (500 mg Intramuscular Given 05/17/24 1822)  azithromycin  (ZITHROMAX ) tablet 1,000 mg (1,000 mg Oral Given 05/17/24 1822)    Initial Impression / Assessment and Plan / UC Course  I have reviewed the triage vital signs and the nursing notes.  Pertinent labs & imaging results that were available during my care of the patient were reviewed by me and considered in my medical decision making (see chart for details).     Patient presents with yellow, mucoid penile discharge suggestive of a sexually transmitted infection, most likely gonorrhea or chlamydia. He reports a recent unprotected sexual encounter with a new partner approximately two weeks ago. He denies dysuria, genital pain, sores, abdominal pain, nausea, vomiting, back pain, or fever. A swab was collected for gonorrhea, chlamydia, and trichomonas testing. Empiric treatment was initiated with an in-clinic dose of ceftriaxone  and azithromycin  1000 mg. While doxycycline  is the preferred treatment for chlamydia, azithromycin  was selected due to concerns regarding the patient's ability to complete a multi-day course given his plan to turn himself in on an outstanding warrant this evening and lack of reliable transportation. The patient was instructed to abstain from all sexual activity until test results are available and symptoms have resolved. He was advised to return for retesting in one week if discharge persists, regardless of test results. Test results will be available via MyChart, and follow-up was encouraged with PCP or STD clinic as needed. ED care is recommended  if he develops fever, severe genital pain, or new concerning symptoms.  Today's evaluation has revealed no signs of a dangerous process. Discussed diagnosis with patient and/or guardian. Patient and/or guardian aware of their diagnosis, possible red flag symptoms to watch out for and need for close follow up. Patient and/or guardian understands verbal and written discharge instructions. Patient and/or guardian comfortable with plan and disposition.  Patient and/or guardian has a clear mental status at this time, good insight into illness (after discussion and teaching) and  has clear judgment to make decisions regarding their care  Documentation was completed with the aid of voice recognition software. Transcription may contain typographical errors. Final Clinical Impressions(s) / UC Diagnoses   Final diagnoses:  Penile discharge  Screen for STD (sexually transmitted disease)     Discharge Instructions      You were seen today for yellow discharge from the penis, which is likely due to a sexually transmitted infection such as gonorrhea or chlamydia. A swab was taken today to test for gonorrhea, chlamydia, and trichomonas. You were treated in the clinic with ceftriaxone  and azithromycin . While doxycycline  is typically preferred for chlamydia, azithromycin  was given due to concerns about you being able to complete a multi-day antibiotic course.  Avoid all sexual activity until your symptoms are gone and your test results are back. This helps prevent spreading infection and avoids re-infection. If your discharge continues after one week, you should be retested, even if your initial results were negative. Test results will be available through your MyChart account.  Drink plenty of fluids, and avoid using harsh soaps or other irritants on the genital area. Keep the area clean and dry.  Follow up with your primary care provider or local health department for further evaluation or treatment if  symptoms persist or worsen.   Go to the emergency room if you develop fever, severe genital pain, painful or swollen testicles, difficulty urinating, or signs of a spreading infection such as redness, swelling, or drainage from the penis.      ED Prescriptions   None    PDMP not reviewed this encounter.   Iola Lukes, [place] 05/17/24 [phone]

## 2024-05-19 ENCOUNTER — Ambulatory Visit (HOSPITAL_COMMUNITY): Payer: Self-pay

## 2024-05-19 LAB — CYTOLOGY, (ORAL, ANAL, URETHRAL) ANCILLARY ONLY
Chlamydia: NEGATIVE
Comment: NEGATIVE
Comment: NEGATIVE
Comment: NORMAL
Neisseria Gonorrhea: POSITIVE — AB
Trichomonas: NEGATIVE

## 2024-07-11 ENCOUNTER — Emergency Department (HOSPITAL_COMMUNITY)
Admission: EM | Admit: 2024-07-11 | Discharge: 2024-07-11 | Disposition: A | Discharge status: Home / Self Care | Attending: Emergency Medicine | Admitting: Emergency Medicine

## 2024-07-11 ENCOUNTER — Other Ambulatory Visit: Payer: Self-pay

## 2024-07-11 DIAGNOSIS — T50901A Poisoning by unspecified drugs, medicaments and biological substances, accidental (unintentional), initial encounter: Secondary | ICD-10-CM | POA: Diagnosis not present

## 2024-07-11 DIAGNOSIS — R451 Restlessness and agitation: Secondary | ICD-10-CM | POA: Insufficient documentation

## 2024-07-11 DIAGNOSIS — T887XXA Unspecified adverse effect of drug or medicament, initial encounter: Secondary | ICD-10-CM | POA: Diagnosis not present

## 2024-07-11 DIAGNOSIS — T50904A Poisoning by unspecified drugs, medicaments and biological substances, undetermined, initial encounter: Secondary | ICD-10-CM | POA: Diagnosis not present

## 2024-07-11 DIAGNOSIS — F1010 Alcohol abuse, uncomplicated: Secondary | ICD-10-CM | POA: Diagnosis present

## 2024-07-11 DIAGNOSIS — R404 Transient alteration of awareness: Secondary | ICD-10-CM | POA: Diagnosis not present

## 2024-07-11 DIAGNOSIS — R9431 Abnormal electrocardiogram [ECG] [EKG]: Secondary | ICD-10-CM | POA: Diagnosis not present

## 2024-07-11 LAB — BASIC METABOLIC PANEL WITH GFR
Anion gap: 11 (ref 5–15)
BUN: 8 mg/dL (ref 6–20)
CO2: 22 mmol/L (ref 22–32)
Calcium: 8.1 mg/dL — ABNORMAL LOW (ref 8.9–10.3)
Chloride: 109 mmol/L (ref 98–111)
Creatinine, Ser: 1 mg/dL (ref 0.61–1.24)
GFR, Estimated: >60 mL/min (ref >60)
Glucose, Bld: 81 mg/dL (ref 70–99)
Potassium: 3.9 mmol/L (ref 3.5–5.1)
Sodium: 142 mmol/L (ref 135–145)

## 2024-07-11 LAB — CBC WITH DIFFERENTIAL/PLATELET
Abs Immature Granulocytes: 0.02 K/uL (ref 0.00–0.07)
Basophils Absolute: 0.1 K/uL (ref 0.0–0.1)
Basophils Relative: 1 %
Eosinophils Absolute: 0.1 K/uL (ref 0.0–0.5)
Eosinophils Relative: 1 %
HCT: 40.5 % (ref 39.0–52.0)
Hemoglobin: 13.1 g/dL (ref 13.0–17.0)
Immature Granulocytes: 0 %
Lymphocytes Relative: 45 %
Lymphs Abs: 2.8 K/uL (ref 0.7–4.0)
MCH: 29.6 pg (ref 26.0–34.0)
MCHC: 32.3 g/dL (ref 30.0–36.0)
MCV: 91.6 fL (ref 80.0–100.0)
Monocytes Absolute: 0.4 K/uL (ref 0.1–1.0)
Monocytes Relative: 7 %
Neutro Abs: 2.9 K/uL (ref 1.7–7.7)
Neutrophils Relative %: 46 %
Platelets: 172 K/uL (ref 150–400)
RBC: 4.42 MIL/uL (ref 4.22–5.81)
RDW: 13.9 % (ref 11.5–15.5)
Smear Review: NORMAL
WBC: 6.3 K/uL (ref 4.0–10.5)
nRBC: 0 % (ref 0.0–0.2)

## 2024-07-11 MED ORDER — SODIUM CHLORIDE 0.9 % IV BOLUS
1000.0000 mL | Freq: Once | INTRAVENOUS | Status: AC
  Administered 2024-07-11: 1000 mL via INTRAVENOUS

## 2024-07-11 NOTE — ED Triage Notes (Addendum)
 Patient arrived by [facility] from jail after reported OD. EMS report pin point pupils and became more altered prior to narcan.  Patient received narcan .4 pta with improved neuro status, reported now sleepy.  CBG 104. Patient following commands.

## 2024-07-11 NOTE — ED Provider Notes (Signed)
  EMERGENCY DEPARTMENT AT [facility] Provider Note   CSN: [id number] Arrival date & time: 07/11/24  1312     Patient presents with: No chief complaint on file.   Michael Sutton is a 33 y.o. male.   HPI   33 year old male presents emergency department in custody of [employer] for concern of OD.  Patient was in the custody of the police, reportedly appeared intoxicated.  Asked to lay down in the cell and quickly fell asleep.  After he was difficult to arouse he received Narcan prior to arrival with immediate awakening.  Patient admits to drinking alcohol and possibly doing other substance prior to being arrested.  He is otherwise not forthcoming, at times yelling and somewhat combative with staff.  There does not appear to be any altercation, report of trauma or complaint from the patient other than being mad at the police officers.  Prior to Admission medications   Not on File    Allergies: Patient has no known allergies.    Review of Systems  Respiratory:  Negative for shortness of breath.   Cardiovascular:  Negative for chest pain.  Gastrointestinal:  Negative for abdominal pain.  Musculoskeletal:  Negative for back pain.  Neurological:  Negative for headaches.    Updated Vital Signs BP 110/81   Physical Exam Vitals and nursing note reviewed.  Constitutional:      Appearance: Normal appearance.     Comments: At times yelling and pulling at monitor cords  HENT:     Head: Normocephalic.     Mouth/Throat:     Mouth: Mucous membranes are moist.  Eyes:     Extraocular Movements: Extraocular movements intact.  Cardiovascular:     Rate and Rhythm: Normal rate.  Pulmonary:     Effort: Pulmonary effort is normal. No respiratory distress.  Skin:    General: Skin is warm.  Neurological:     General: No focal deficit present.     Mental Status: He is alert and oriented to person, place, and time. Mental status is at baseline.  Psychiatric:        Mood and  Affect: Mood normal.     (all labs ordered are listed, but only abnormal results are displayed) Labs Reviewed  CBC WITH DIFFERENTIAL/PLATELET  BASIC METABOLIC PANEL WITH GFR    EKG: EKG Interpretation Date/Time:  Saturday July 11 2024 13:22:56 EDT Ventricular Rate:  91 PR Interval:  155 QRS Duration:  104 QT Interval:  332 QTC Calculation: 409 R Axis:   60  Text Interpretation: Sinus rhythm , Ventricular premature complex Aberrant conduction of SV complex(es) Probable left atrial enlargement Inferolateral infarct, acute (LCx) Borderline ST elevation, anterior leads Artifact in lead(s) V4 and baseline wander in lead(s) II III aVR aVF >>> Acute MI <<< wandering baseline and artifact, notching j point, no STEMI Confirmed by Bari Flank [phone]) on 07/11/2024 1:42:30 PM  Radiology: No results found.   Procedures   Medications Ordered in the ED  sodium chloride  0.9 % bolus 1,000 mL (1,000 mLs Intravenous New Bag/Given 07/11/24 1347)                                    Medical Decision Making Amount and/or Complexity of Data Reviewed Labs: ordered.   33 year old male presents emergency department in the custody of [employer].  Was arrested after stealing, admitted to drinking and possible substance abuse.  After he  laid down on the cell and fell asleep he was unable to be aroused.  Was given Narcan prior to arrival and became awake.  Patient is agitated and otherwise not a forthcoming historian.  But denies any complaints.  Has no traumatic findings on exam.  Vitals are normal and stable.  Blood work did not show any acute abnormality, patient was given IV fluids, allowed to eat and drink.  He is been monitored now about 3 hours since the Narcan administration.  Lung sounds are clear, he has easy respirations with 100% oxygenation.  Will plan to discharge back into [employer] custody.  Patient at this time appears safe and stable for discharge and close outpatient follow up. Discharge plan  and strict return to ED precautions discussed, patient verbalizes understanding and agreement.     Final diagnoses:  None    ED Discharge Orders     None          Bari Roxie HERO, DO 07/11/24 1615

## 2024-07-11 NOTE — Discharge Instructions (Signed)
 You were seen in the department.  There was concern that you had an accidental overdose, you are given Narcan with good response.  Your blood work was normal.  You were given IV fluids and allowed to metabolize.  You are being discharged into [employer] custody.
